# Patient Record
Sex: Female | Born: 1940 | Race: White | Hispanic: No | Smoking: Former smoker
Health system: Southern US, Community
[De-identification: ages and names within clinical notes are randomized; demographics above are authoritative.]

## PROBLEM LIST (undated history)

## (undated) DIAGNOSIS — F32A Depression, unspecified: Secondary | ICD-10-CM

## (undated) DIAGNOSIS — D649 Anemia, unspecified: Secondary | ICD-10-CM

## (undated) DIAGNOSIS — Z86010 Personal history of colonic polyps: Secondary | ICD-10-CM

## (undated) DIAGNOSIS — E785 Hyperlipidemia, unspecified: Secondary | ICD-10-CM

## (undated) DIAGNOSIS — D509 Iron deficiency anemia, unspecified: Secondary | ICD-10-CM

## (undated) DIAGNOSIS — K228 Other specified diseases of esophagus: Secondary | ICD-10-CM

## (undated) DIAGNOSIS — M1990 Unspecified osteoarthritis, unspecified site: Secondary | ICD-10-CM

## (undated) DIAGNOSIS — I10 Essential (primary) hypertension: Secondary | ICD-10-CM

## (undated) DIAGNOSIS — H9193 Unspecified hearing loss, bilateral: Secondary | ICD-10-CM

## (undated) DIAGNOSIS — J479 Bronchiectasis, uncomplicated: Secondary | ICD-10-CM

## (undated) DIAGNOSIS — E119 Type 2 diabetes mellitus without complications: Secondary | ICD-10-CM

## (undated) DIAGNOSIS — K219 Gastro-esophageal reflux disease without esophagitis: Secondary | ICD-10-CM

## (undated) DIAGNOSIS — Z972 Presence of dental prosthetic device (complete) (partial): Secondary | ICD-10-CM

## (undated) DIAGNOSIS — T7840XA Allergy, unspecified, initial encounter: Secondary | ICD-10-CM

## (undated) DIAGNOSIS — H269 Unspecified cataract: Secondary | ICD-10-CM

## (undated) DIAGNOSIS — F329 Major depressive disorder, single episode, unspecified: Secondary | ICD-10-CM

## (undated) HISTORY — PX: WISDOM TOOTH EXTRACTION: SHX21

## (undated) HISTORY — DX: Major depressive disorder, single episode, unspecified: F32.9

## (undated) HISTORY — DX: Unspecified osteoarthritis, unspecified site: M19.90

## (undated) HISTORY — DX: Unspecified cataract: H26.9

## (undated) HISTORY — DX: Iron deficiency anemia, unspecified: D50.9

## (undated) HISTORY — DX: Bronchiectasis, uncomplicated: J47.9

## (undated) HISTORY — DX: Essential (primary) hypertension: I10

## (undated) HISTORY — DX: Gastro-esophageal reflux disease without esophagitis: K21.9

## (undated) HISTORY — DX: Presence of dental prosthetic device (complete) (partial): Z97.2

## (undated) HISTORY — DX: Allergy, unspecified, initial encounter: T78.40XA

## (undated) HISTORY — DX: Anemia, unspecified: D64.9

## (undated) HISTORY — DX: Hyperlipidemia, unspecified: E78.5

## (undated) HISTORY — DX: Personal history of colonic polyps: Z86.010

## (undated) HISTORY — DX: Other specified diseases of esophagus: K22.8

## (undated) HISTORY — DX: Type 2 diabetes mellitus without complications: E11.9

## (undated) HISTORY — DX: Depression, unspecified: F32.A

## (undated) HISTORY — DX: Unspecified hearing loss, bilateral: H91.93

## (undated) HISTORY — PX: UPPER GASTROINTESTINAL ENDOSCOPY: SHX188

---

## 1975-07-12 HISTORY — PX: OTHER SURGICAL HISTORY: SHX169

## 2006-01-24 ENCOUNTER — Ambulatory Visit: Payer: Self-pay | Admitting: Cardiology

## 2006-01-27 ENCOUNTER — Ambulatory Visit: Payer: Self-pay

## 2006-01-31 ENCOUNTER — Ambulatory Visit: Payer: Self-pay | Admitting: Internal Medicine

## 2006-02-24 ENCOUNTER — Ambulatory Visit: Payer: Self-pay | Admitting: Cardiology

## 2006-03-10 ENCOUNTER — Ambulatory Visit: Payer: Self-pay | Admitting: Internal Medicine

## 2006-03-16 ENCOUNTER — Encounter (INDEPENDENT_AMBULATORY_CARE_PROVIDER_SITE_OTHER): Payer: Self-pay | Admitting: *Deleted

## 2006-03-16 ENCOUNTER — Ambulatory Visit: Payer: Self-pay | Admitting: Internal Medicine

## 2006-04-13 ENCOUNTER — Ambulatory Visit: Payer: Self-pay | Admitting: Internal Medicine

## 2006-04-20 ENCOUNTER — Ambulatory Visit: Payer: Self-pay | Admitting: Internal Medicine

## 2007-05-16 ENCOUNTER — Ambulatory Visit: Payer: Self-pay | Admitting: Internal Medicine

## 2007-08-31 DIAGNOSIS — E785 Hyperlipidemia, unspecified: Secondary | ICD-10-CM

## 2007-08-31 DIAGNOSIS — K219 Gastro-esophageal reflux disease without esophagitis: Secondary | ICD-10-CM

## 2007-08-31 DIAGNOSIS — K449 Diaphragmatic hernia without obstruction or gangrene: Secondary | ICD-10-CM | POA: Insufficient documentation

## 2007-08-31 DIAGNOSIS — I10 Essential (primary) hypertension: Secondary | ICD-10-CM | POA: Insufficient documentation

## 2007-08-31 DIAGNOSIS — K5900 Constipation, unspecified: Secondary | ICD-10-CM | POA: Insufficient documentation

## 2007-08-31 DIAGNOSIS — R002 Palpitations: Secondary | ICD-10-CM

## 2009-02-17 ENCOUNTER — Encounter (INDEPENDENT_AMBULATORY_CARE_PROVIDER_SITE_OTHER): Payer: Self-pay | Admitting: *Deleted

## 2009-03-27 ENCOUNTER — Ambulatory Visit: Payer: Self-pay | Admitting: Internal Medicine

## 2009-03-27 DIAGNOSIS — Z860100 Personal history of colon polyps, unspecified: Secondary | ICD-10-CM

## 2009-03-27 HISTORY — DX: Personal history of colonic polyps: Z86.010

## 2009-03-27 HISTORY — DX: Personal history of colon polyps, unspecified: Z86.0100

## 2009-05-01 ENCOUNTER — Ambulatory Visit: Payer: Self-pay | Admitting: Internal Medicine

## 2009-05-14 ENCOUNTER — Ambulatory Visit: Payer: Self-pay | Admitting: Internal Medicine

## 2009-05-14 HISTORY — PX: COLONOSCOPY: SHX174

## 2009-08-03 ENCOUNTER — Telehealth (INDEPENDENT_AMBULATORY_CARE_PROVIDER_SITE_OTHER): Payer: Self-pay | Admitting: *Deleted

## 2010-08-10 NOTE — Progress Notes (Signed)
   Phone Note Call from Patient   Caller: PT  Details for Reason: Records Initial call taken by: Waynetta Pean with Pt, she is asking for her Stress to be sen tover to another doctor, I have emailed her a ROI.   Appended Document:  Recieved ROI back, Faxed Records over to Dr.Steven Rohrbeck's office

## 2010-11-23 NOTE — Assessment & Plan Note (Signed)
[facility]                         GASTROENTEROLOGY OFFICE NOTE   NAME:Lepore, Juliet                          MRN:          [id number]  DATE:05/16/2007                            DOB:          [birth year]    CHIEF COMPLAINT:  Discuss stomach polyps.   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  I  brought her back in just to review things.  She also has reflux disease.  She has switched from Omeprazole to Zantac 75 mg b.i.d., but is having  to take some Pepto-Bismol later in the day, which is causing some  constipation problems.  She came off the Omeprazole because of the  concern about the fundic gland polyps and their relationship to the PPI  therapy.  She is otherwise doing well.   MEDICATIONS:  Listed and reviewed in the chart.   ALLERGIES:  Listed and reviewed, and they are updated, as well.   PAST MEDICAL HISTORY:  See January 31, 2006 and April 13, 2006 notes.   Height 5 feet, 6 inches.  Weight 168 pounds.  Pulse 63, blood pressure  120/78.   Of note, there is no dysphagia.  Her weight is stable.   We also discussed her adenomatous colon polyp.  I do not think there is  any link between that and her fundic gland polyps, though there can be  some familial polyposis type syndromes.  She does not really have  evidence for that, and I really think that the fundic gland polyps are  related to her proton pump inhibitor therapy.   ASSESSMENT:  1. Benign fundic gland polyps.  2. Gastroesophageal reflux disease, almost controlled on Zantac 75 mg      b.i.d. plus Pepto-Bismol.  3. Constipation related to Pepto-Bismol.  4. Previous history of adenomatous colon polyp.   PLAN:  1. I have prescribed ranitidine 150 mg b.i.d.  2. Stop the Pepto-Bismol.  3. If she has persistent problems, she is to call me back.  4. Plan for repeat colonoscopy in 2010.  She had a sessile 8 mg      tubular adenoma.  I chose 3-year follow up rather  than 5 years      because of the sessile nature of the lesion and these multiple      polyps does raise a small question about a polyp syndrome, but I do      think these      are really from her proton pump inhibitor therapy, and we have      agreed not to re-biopsy them today.     Iva Boop, MD,FACG  Electronically Signed    CEG/MedQ  DD: 05/16/2007  DT: 05/17/2007  Job #: [id number]   cc:   Mosetta Putt, M.D.

## 2010-11-26 NOTE — Assessment & Plan Note (Signed)
[facility]                           GASTROENTEROLOGY OFFICE NOTE   NAME:[name], [name]                          MRN:          [id number]  DATE:01/31/2006                            DOB:          07/15/1940    REASON FOR CONSULTATION:  Gastric polyps.  Never had colon cancer screening.   ASSESSMENT:  38.  71 year old white woman with a history of multiple gastric polyps that I      assume are benign fundic gland polyps.  Last endoscopy was a year ago in      [facility].  I do not have those records other than a discharge sheet      she had and it indicated that she had an inlet patch in the esophagus      and had numerous gastric polyps.  She tells me the biopsies were      negative.  She also has a small hiatal hernia.  I have reviewed records      going back to 1999 with that respect.  I suspect these are fundic gland      polyps, perhaps potentially related to chronic proton pump inhibitor use      which she takes for gastroesophageal reflux disease.  2.  She is an appropriate candidate for colon cancer screening.  She has      declined to have that in the past but is now agreeable to colonoscopy.  3.  She may be at high risk of colon polyps given these numerous gastric      polyps.  Overall I suspect the gastric polyps are benign and not a big      issue and could simply be followed if at all.   PLAN:  1.  She raised the question of getting off her proton pump inhibitors so a      Nissen fundoplication is a possibility.  I have given her some      literature about that.  2.  Because of the potential for a fundoplication a repeat endoscopy with      measurement of the Z line (in case we need to do pH probe) will be      undertaken as well as examination and sampling of her polyps again.  3.  Schedule screening colonoscopy.  4.  Risks, benefits and indication of disease have been explained.   HISTORY:  70 year old woman with problems as above.   On daily omeprazole she  tends to do pretty well with limited, if any reflux.  She takes that in the  evening typically.  Her bowel habits are okay.  She belches frequently and  has a dull ache behind the right shoulder blade when she belches.  I have  reviewed the records of her previous endoscopies and these are in my office  notes.  She has been trying to lose a little of the weight.  There is no  regurgitation, dysphagia or hematemesis or other GI bleeding.  She recently  had a stress test.  She has had some chest discomfort.   MEDICATIONS:  1.  Bisoprolol 5 mg daily.  2.  Hydrochlorothiazide 25 mg daily.  3.  Diovan 80 mg daily.  4.  Simvastatin 40 mg daily.  5.  Omeprazole 20 mg daily.  6.  Multivitamin daily.   ALLERGIES:  Drug allergies over-the-counter COLD MEDICATIONS cause  palpitations.  CODEINE causes nausea and vomiting.  ACE INHIBITORS  cause  some sort of palpitation.   PAST MEDICAL HISTORY:  1.  Stress test was clinically negative, electrically positive.  2.  Hypertension.  3.  Dyslipidemia.  4.  Palpitations, tachyarrhythmias/tachycardia.  5.  Allergies.  6.  Osteoarthritis.  7.  Hysterectomy in 1977.  8.  The patient did have cervical cancer which prompted the hysterectomy.   FAMILY HISTORY:  No colon cancer. A sister has Hepatitis C.   SOCIAL HISTORY:  She is married.  She lives in [place].  Her daughter  referred her to Dr. Duaine Dredge and physicians in [place].  She was living  in [place] at one point, that is where she was from and she moved down  here a number of years ago.  No alcohol, tobacco or drugs at this point.  Prescription plan is [facility] Medicare D.   REVIEW OF SYSTEMS:  See medical history form.   PHYSICAL EXAMINATION:  GENERAL:  No acute distress, well developed, well  nourished white woman.  VITAL SIGNS:  Height 5 foot 6 inches.  Weight 171 pounds.  Blood pressure  130/74.  Pulse 80.  HEENT:  Eyes anicteric. ENT normal mouth,  throat, pharynx.  NECK:  Supple, no masses.  CHEST:  Clear.  HEART:  S1, S2.  ABDOMEN:  Soft, non-tender without mass.  LYMPHATICS:  No neck crease.  EXTREMITIES:  No edema.  SKIN:  No rash.  NEUROLOGICAL:  Alert and oriented x3.   LABORATORY DATA:  I have reviewed CBC, CMET from Dr. Duaine Dredge on June 26 it  is normal.  TSH was normal.  Lipase normal.  Office notes reviewed as well.   Upper gastrointestinal endoscopy September 26, 1997 showed 13 small polyps.  Upper gastrointestinal endoscopy September 22, 2000 showed a 4 cm hiatal hernia  and the gastric polyps as well.   In the EGD Nov 16, 2004 the discharge instructions are reviewed.  I  appreciate the opportunity to care for this patient.                                   Iva Boop, MD, [facility]   CEG/MedQ  DD:  01/31/2006  DT:  02/01/2006  Job #:  [id number]   cc:   Mosetta Putt, MD

## 2010-11-26 NOTE — Assessment & Plan Note (Signed)
[facility]                           GASTROENTEROLOGY OFFICE NOTE   NAME:[name], [name]                          MRN:          [id number]  DATE:04/13/2006                            DOB:          04-13-1941    CHIEF COMPLAINT:  Followup after endoscopy and colonoscopy, epigastric pain  and hemorrhoids.   HISTORY OF PRESENT ILLNESS:  A 70 year old, white woman who recently had  upper endoscopy and colonoscopy to followup gastric polyps and for  screening.  She had multiple fundi gland polyps.  This is as before.  She  had a tubular adenoma (8 mm) removed.  A 3-year followup is recommended.  She has received her letter.   For the past 3-4 days, she has had epigastric pain radiating to the back  with meals.  It seems to have resolved in the last 24 hours.  She went to  soup and crackers.  The pain presented every time she ate.  She does not  recall having pain like that recently.  Years ago, she had an ultrasound  that showed gravel, she says.   Regarding her stomach polyps, she has been concerned about these.  We  discussed this today.  She tells me they first started when she was on  Zantac.  She has no reflux symptoms on omeprazole, but could not control her  symptoms on Zantac.  This endoscopy is about the third she has had over the  [place]  She has a swollen nodule at the  anus, thought to be an external hemorrhoid that has thrombosed.  Her  gynecologist told her this and she was put on betamethasone cream for this.   MEDICATIONS:  Listed and reviewed in the chart.   PAST MEDICAL HISTORY:  Reviewed in the chart from note of January 31, 2006.   PHYSICAL EXAMINATION:  GENERAL:  Well-developed, well-nourished, white woman  in no acute distress.  VITAL SIGNS:  Weight 171 pounds, pulse 60, blood pressure 126/74.  ABDOMEN:  Soft, nontender without organomegaly or mass.  RECTAL:  Inspection of the rectal area with  female nursing staff present  shows a small pea-sized nodule at approximately 8 o'clock in the left  lateral decubitus position that his mildly tender.  This is consistent with  an external thrombosed hemorrhoid.  There has been no fever, jaundice or  dark urine.   ASSESSMENT:  1. Benign fundi glad polyps.  This could be related to proton pump      inhibitor and/or H2-blocker use.  There has been no evidence of cancer      on three endoscopies.  These in general are not precancerous lesions      and this is explained to the patient.  She had inquired about the      possibility of getting off her proton pump inhibitor for her reflux to      try and to reduce these polyps.  She had wondered about a laparoscopic      fundoplication procedure.  I explained this procedure to her today and  offered an appointment to a surgeon to discuss, but she is not inclined      to do so at this point.  I think that is a sound decision based upon      the available evidence in her situation.  2. Gastroesophageal reflux disease as above.  3. Thrombosed hemorrhoid, try analpram cream over the betamethasone, Sitz      baths, should resolve within a few weeks.  Call back if not.  4. Epigastric pain.  This sounds like it could be symptomatic      cholelithiasis.  Will check LFTs, amylase, lipase and ultrasound.  If      she has gallstones, I think it would be reasonable for her to see a      surgeon about possible cholecystectomy.  I will call with the results.      Handouts on gallstones and laparoscopic cholecystectomy are provided to      the patient today.   I appreciate the opportunity to care for this patient.       Iva Boop, MD,FACG      CEG/MedQ  DD:  04/13/2006  DT:  04/14/2006  Job #:  [id number]   cc:   Mosetta Putt, M.D.

## 2010-11-26 NOTE — Assessment & Plan Note (Signed)
[facility]                              CARDIOLOGY OFFICE NOTE   NAME:[name] [name], [name]                          MRN:          [id number]  DATE:01/24/2006                            DOB:          1941/05/04    The primary is Dr. Mosetta Putt.   REASON FOR REFERRAL:  Evaluate patient with hypertension, palpitations and  an abnormal EKG.   HISTORY OF PRESENT ILLNESS:  The patient is a pleasant 70 year old white  female with no prior cardiac history.  She did have a treadmill some years  ago that apparently was normal.  She also had palpitations 10 years ago and  was started on a beta-blocker.  She thinks this improved her symptoms.  She  continues to have skipped heart beats.  She wakes with her heart rate  feeling like it is going fast, however, when she is taking it it has only  been in the 80s.  She does notice rare palpitations, particularly when she  is anxious.  She describes them as isolated skipped beats.  She has not had  any sustained tachyarrhythmias after waking during the day.  She has noticed  her heart rate being slightly irregular occasionally when taking her blood  pressure but does not always feel this.  She has worn a Holter monitor.  She  had one episode of palpitations that did not correlate with arrhythmia.  There were some premature ectopic complexes noted, but it was otherwise  sinus rhythm.   The patient was also noted to have an EKG with anterior T-wave inversions  without old EKGs for comparison.   The patient is active, climbing stairs and chasing after two grandchildren  who she takes care of quite frequently.  She says she cannot bring on any  symptoms with this.  She denies any chest pressure, neck discomfort, arm  discomfort, activity-induced nausea, vomiting or excessive diaphoresis.  She  has no shortness of breath and denies any PND or orthopnea.   PAST MEDICAL HISTORY:  Hypertension x10 years, gastroesophageal  reflux  disease, hiatal hernia, hyperlipidemia.   PAST SURGICAL HISTORY:  Cervical biopsy and hysterectomy.   ALLERGIES:  None.   MEDICATIONS:  Bisoprolol 5 mg q.d., hydrochlorothiazide 25 mg, omeprazole 20  mg q.d., Diovan 80 mg q.d., simvastatin 40 mg q.d.   SOCIAL HISTORY:  The patient is a homemaker.  She is married.  She has one  child.   FAMILY HISTORY:  Contributory for a father dying of myocardial infarction at  59 and a sister who is alive at age 68 but had peripheral arterial disease  and MI at 60.   REVIEW OF SYSTEMS:  As stated in the HPI and otherwise positive for hearing  loss, fluctuating hypertension, diarrhea, knee pain.  Negative for other  systems.   PHYSICAL EXAMINATION:  GENERAL:  The patient is slightly anxious but in no  distress.  VITAL SIGNS:  Blood pressure 178/84, heart rate 57 and regular.  Weight 171  pounds.  HEENT:  Eyes unremarkable.  Pupils are equal, round and reactive to light.  Fundi within normal limits.  Oral mucosa unremarkable.  NECK:  No jugular venous distention.  Wave form within normal limits.  Carotid upstroke brisk and symmetric.  No bruits or thyromegaly.  LYMPHATICS:  No cervical, axillary or inguinal adenopathy.  LUNGS:  Clear to auscultation bilaterally.  BACK:  No costovertebral angle tenderness.  CHEST:  Unremarkable.  HEART:  PMI not displaced or sustained.  S1 and S2 within normal limits.  No  S3.  No S4.  No murmurs.  ABDOMEN:  Flat.  Positive bowel sounds.  Normal in frequency and pitch.  No  bruits.  No rebound. No guarding.  No midline pulsatile mass.  No  hepatomegaly.  No splenomegaly.  SKIN:  No rashes.  No nodules.  EXTREMITIES:  Pulses 2+ throughout.  No edema.  No cyanosis or clubbing.  NEURO:  Oriented to person, place and time.  Cranial nerves II-XII are  grossly intact.  Motor grossly intact.   EKG:  Sinus rhythm, rate 57.  Axis within normal limits.  Intervals within  normal limits.  Anterior T-wave  inversions unchanged from the EKG done in  Dr. Geoffery Lyons office.   Palpitations.  I reviewed this with her and the nature of these on the  Holter.  I described the benign nature and suggested continued conservative  management and continuation of low-dose beta-blocker.  If she has more  problems in the future she will let me know but right now she was comforted  by the fact that these are benign lesions.  Hypertension.  I reviewed her entire recorded blood pressure diary on her  automated cuff.  She has some asymptomatic systolics in the 90s.  There was  one in the 60s which was probably an error.  She will occasionally go to a  systolic in the 150s, but this does not appear to be sustained.  At this  point I would not make a change from medical regimen.  I would have her  continue to keep the diary so we can see if there is a trend upward.  Abnormal EKG.  She has a very strong family history.  Given this, I will  screen her with a stress perfusion study.  She will continue with aggressive  primary risk reduction per Dr. Duaine Dredge.  We  discussed this at length.  Followup.  I will see her back based on the results of the stress test.                               [name] Rotunda, MD, [facility]    JH/MedQ  DD:  01/24/2006  DT:  01/25/2006  Job #:  [id number]   cc:   Mosetta Putt, MD

## 2010-11-26 NOTE — Assessment & Plan Note (Signed)
[facility]                              CARDIOLOGY OFFICE NOTE   NAME:Northup, Mitchelle                          MRN:          [id number]  DATE:02/24/2006                            DOB:          12-18-1940    PRIMARY CARE PHYSICIAN:  Mosetta Putt, M.D.   REASON FOR PRESENTATION:  Hypertension and palpitations.   HISTORY OF PRESENT ILLNESS:  The patient returns for followup.  At the last  visit discussed the perfusion study which demonstrated an ejection fraction  of greater than 70%.  No evidence of ischemia or infarction.  She is back to  review this.  She has many questions still about her PVC's and PAC's.  They  are not particularly symptomatic, particularly if she takes bisoprolol.  She  has had no chest discomfort or shortness of breath.  She has had no pre-  syncope or syncope.  She has no PND or orthopnea.  Of note, she brought her  blood pressure machine, and though she has been hypertensive in the office,  she has very well-controlled blood pressures at home with a machine that has  been correlated with office readings.   PAST MEDICAL/SURGICAL HISTORY:  1. Hypertension x10 years.  2. Gastroesophageal reflux disease.  3. Hiatal hernia.  4. Hyperlipidemia.  5. Cervical biopsy.  6. Hysterectomy.   ALLERGIES:  No known drug allergies.   MEDICATIONS:  1. Bisoprolol 5 mg daily.  2. Hydrochlorothiazide 25 mg daily.  3. Diovan 80 mg daily.  4. Simvastatin 40 mg daily.  5. Omeprazole 20 mg daily.   REVIEW OF SYSTEMS:  As stated in the HPI.  Otherwise negative for other  systems.   PHYSICAL EXAMINATION:  GENERAL:  The patient is in no distress.  VITAL SIGNS:  Blood pressure 162/82, heart rate 59 and regular, weight 173  pounds, body mass index is 28.  NECK:  No jugular venous distention.  Wave form within normal limits.  Carotid upstroke brisk and symmetrical.  No bruits, no thyromegaly.  LYMPHATICS:  No nodes.  LUNGS:  Clear to  auscultation bilaterally.  BACK:  No costovertebral angle tenderness.  CHEST:  Unremarkable.  HEART:  PMI not displaced or sustained.  S1 and S2 within normal limits.  No  S3, no S4.  No murmurs.  ABDOMEN:  Flat, positive bowel sounds.  Normal frequency and pitch.  No  bruits, no rebound, no guarding, no midline pulsatile mass, no organomegaly.  SKIN:  No rashes.  EXTREMITIES/NEUROLOGIC:  Extremities with 1+ pulses, no edema.   ELECTROCARDIOGRAM:  Sinus bradycardia, rate 59, axis within normal limits.  Intervals within normal limits.  No ST-T wave changes.   ASSESSMENT/PLAN:  1. Premature ventricular contractions/premature atrial contractions:  We      discussed this again at length.  She understands the benign nature and      feels much better about it.  She will continue with the bisoprolol, as      this does seem to suppress some of the symptoms.  2. Hypertension:  Blood pressure seems to be white coat, as  it is      controlled in the office.  She will keep a diary for Dr. Mosetta Putt      and can have her Diovan titrated as needed going forward.  3. Abnormal electrocardiogram:  There is no evidence of ischemia or      infarction.  We discussed primary risk reduction.  No further testing      is indicated.   FOLLOWUP:  She can come back to this clinic as needed.                               Rollene Rotunda, MD, [facility]    JH/MedQ  DD:  02/24/2006  DT:  02/24/2006  Job #:  [id number]   cc:   Mosetta Putt, MD

## 2014-04-03 ENCOUNTER — Other Ambulatory Visit: Payer: Self-pay | Admitting: Dermatology

## 2014-05-11 HISTORY — PX: COLONOSCOPY: SHX174

## 2014-06-23 ENCOUNTER — Encounter: Payer: Self-pay | Admitting: Internal Medicine

## 2014-10-14 ENCOUNTER — Encounter: Payer: Self-pay | Admitting: Internal Medicine

## 2015-07-12 HISTORY — PX: TOTAL HIP ARTHROPLASTY: SHX124

## 2017-10-11 ENCOUNTER — Telehealth: Payer: Self-pay | Admitting: Internal Medicine

## 2017-10-20 NOTE — Telephone Encounter (Signed)
Dr.Gessner reviewed records and accepted patient for colonoscopy due to hx of polyps. Left message on voicemail for patient to call back and schedule. Records placed in referral box for now.

## 2017-10-20 NOTE — Telephone Encounter (Signed)
Pt returned call and wants to schedule in July when her daughter is out of work for the summer.  She said she will try to call back next week and see if the July calender is available.  Records in the review folder

## 2017-10-30 ENCOUNTER — Encounter: Payer: Self-pay | Admitting: Internal Medicine

## 2017-11-06 ENCOUNTER — Telehealth: Payer: Self-pay | Admitting: Internal Medicine

## 2017-11-06 NOTE — Telephone Encounter (Signed)
Patient advised that she will need to have the labs sent to [place].  She will have her primary care fax the results

## 2017-11-06 NOTE — Telephone Encounter (Signed)
Patient states she just had a ov with her pcp in [place]. Patient states due to her lab work there, her pcp says she is anemic and has low hemoglobin. Patient states pcp wants her to do an endo with colon on 7.22.19. Pt wants to know if she can direct schedule or do ov first. Please advise.

## 2017-11-07 NOTE — Telephone Encounter (Signed)
Records in your office.  Patient would like to have an endo with her colonoscopy scheduled in June.  See labs she was told she was anemic and needed a EGD.  We have iron studies but no CBC (in your office).  No other problems or complaints from the patient.  She is scheduled for colon in June and would like to add on EGD.

## 2017-11-10 NOTE — Telephone Encounter (Signed)
Ferritin 28 10 % sat w/ NL TIBC May have some iron deficiency so OK TO ADD EGD TO COLONOSCOPY DX IRON DEFICIENCY

## 2017-11-10 NOTE — Telephone Encounter (Signed)
Endo added.  I left a detailed message for the patient to keep pre-visit appt and that Dr. Leone Payor ok'd double.

## 2017-11-23 ENCOUNTER — Telehealth: Payer: Self-pay | Admitting: Internal Medicine

## 2017-11-23 NOTE — Telephone Encounter (Signed)
Patient notified that at this time No openings, but I assured her that I would monitor for an earlier appt.

## 2018-01-16 ENCOUNTER — Ambulatory Visit (AMBULATORY_SURGERY_CENTER): Payer: Self-pay | Admitting: *Deleted

## 2018-01-16 ENCOUNTER — Other Ambulatory Visit: Payer: Self-pay

## 2018-01-16 VITALS — Ht 65.0 in | Wt 172.8 lb

## 2018-01-16 DIAGNOSIS — D500 Iron deficiency anemia secondary to blood loss (chronic): Secondary | ICD-10-CM

## 2018-01-16 DIAGNOSIS — Z86010 Personal history of colonic polyps: Secondary | ICD-10-CM

## 2018-01-16 NOTE — Progress Notes (Signed)
Patient denies any allergies to egg or soy products. Patient denies complications with anesthesia/sedation.  Patient denies oxygen use at home and denies diet medications. Pamphlet was given on colonoscopy.  Patient informed to stop her iron pills on Wednesday, July 17, five days prior to procedure.  Patient verbalized understanding.

## 2018-01-26 ENCOUNTER — Telehealth: Payer: Self-pay | Admitting: Internal Medicine

## 2018-01-26 NOTE — Telephone Encounter (Signed)
Patient has questions about what she can drink on Monday for her prep.  She is "prediabetic".  All questions answered about what she can drink on her prep day.  She will call back for any additional questions or concerns.

## 2018-01-26 NOTE — Telephone Encounter (Signed)
Pt has questions regarding instructions and blood sugar levels on her procedure scheduled on Monday at 8:30am.

## 2018-01-29 ENCOUNTER — Ambulatory Visit (AMBULATORY_SURGERY_CENTER): Payer: Medicare Other | Admitting: Internal Medicine

## 2018-01-29 ENCOUNTER — Encounter: Payer: Self-pay | Admitting: Internal Medicine

## 2018-01-29 VITALS — BP 144/57 | HR 56 | Temp 97.8°F | Resp 18 | Ht 65.0 in | Wt 172.0 lb

## 2018-01-29 DIAGNOSIS — D500 Iron deficiency anemia secondary to blood loss (chronic): Secondary | ICD-10-CM

## 2018-01-29 DIAGNOSIS — K317 Polyp of stomach and duodenum: Secondary | ICD-10-CM

## 2018-01-29 DIAGNOSIS — D508 Other iron deficiency anemias: Secondary | ICD-10-CM

## 2018-01-29 DIAGNOSIS — Z86010 Personal history of colonic polyps: Secondary | ICD-10-CM

## 2018-01-29 MED ORDER — SODIUM CHLORIDE 0.9 % IV SOLN: 500.0000 mL | Freq: Once | INTRAVENOUS | Status: DC

## 2018-01-29 NOTE — Patient Instructions (Addendum)
I did not see anything bad - no cancer!  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 Once I see biopsy results I will let you know.  I appreciate the opportunity to care for you. Iva Booparl E. Velicia Dejager, MD, [facility]   Please read handouts on polyps, hemorrhoids, and diverticulosis. Continue present medications.  YOU HAD AN ENDOSCOPIC PROCEDURE TODAY AT THE [facility]:   Refer to the procedure report that was given to you for any specific questions about what was found during the examination.  If the procedure report does not answer your questions, please call your gastroenterologist to clarify.  If you requested that your care partner not be given the details of your procedure findings, then the procedure report has been included in a sealed envelope for you to review at your convenience later.  YOU SHOULD EXPECT: Some feelings of bloating in the abdomen. Passage of more gas than usual.  Walking can help get rid of the air that was put into your GI tract during the procedure and reduce the bloating. If you had a lower endoscopy (such as a colonoscopy or flexible sigmoidoscopy) you may notice spotting of blood in your stool or on the toilet paper. If you underwent a bowel prep for your procedure, you may not have a normal bowel movement for a few days.  Please Note:  You might notice some irritation and congestion in your nose or some drainage.  This is from the oxygen used during your procedure.  There is no need for concern and it should clear up in a day or so.  SYMPTOMS TO REPORT IMMEDIATELY:   Following lower endoscopy (colonoscopy or flexible sigmoidoscopy):  Excessive amounts of blood in the stool  Significant  tenderness or worsening of abdominal pains  Swelling of the abdomen that is new, acute  Fever of 100F or higher   Following upper endoscopy (EGD)  Vomiting of blood or coffee ground material  New chest pain or pain under the shoulder blades  Painful or persistently difficult swallowing  New shortness of breath  Fever of 100F or higher  Black, tarry-looking stools  For urgent or emergent issues, a gastroenterologist can be reached at any hour by calling [phone].   DIET:  We do recommend a small meal at first, but then you may proceed to your regular diet.  Drink plenty of fluids but you should avoid alcoholic beverages for 24 hours.  ACTIVITY:  You should plan to take it easy for the rest of today and you should NOT DRIVE or use heavy machinery until tomorrow (because of the sedation medicines used during the test).    FOLLOW UP: Our staff will call the number listed on your records the next business day following your procedure to check on you and address any questions or concerns that you may have regarding the information given to you following your procedure. If we do not reach you, we will leave a message.  However, if you are feeling well and you are not experiencing any problems, there is no need to return our call.  We will assume that you have returned to your regular daily activities without incident.  If any biopsies were taken you will be contacted by phone or by letter within the next 1-3 weeks.  Please call us at [phone] if you have not heard about the biopsies in 3 weeks.    SIGNATURES/CONFIDENTIALITY: You and/or your care partner have signed paperwork which will be entered into your electronic medical record.  These signatures attest to the fact that that the information above on your After Visit Summary has been reviewed and is understood.  Full responsibility of the confidentiality of this discharge information lies with you and/or your care-partner.

## 2018-01-29 NOTE — Op Note (Addendum)
[facility] Patient Name: [name] [name] Procedure Date: 01/29/2018 9:17 AM MRN: [id number] Endoscopist: Iva Boop , MD Age: 77 Referring MD:  Date of Birth: Mar 14, 1941 Gender: Female Account #: [id number] Procedure:                Colonoscopy Indications:              Iron deficiency anemia, Personal history of colonic                            polyps Medicines:                Propofol per Anesthesia, Monitored Anesthesia Care Procedure:                Pre-Anesthesia Assessment:                           - Prior to the procedure, a History and Physical                            was performed, and patient medications and                            allergies were reviewed. The patient's tolerance of                            previous anesthesia was also reviewed. The risks                            and benefits of the procedure and the sedation                            options and risks were discussed with the patient.                            All questions were answered, and informed consent                            was obtained. Prior Anticoagulants: The patient has                            taken no previous anticoagulant or antiplatelet                            agents. ASA Grade Assessment: II - A patient with                            mild systemic disease. After reviewing the risks                            and benefits, the patient was deemed in                            satisfactory condition to undergo the procedure.  After obtaining informed consent, the colonoscope                            was passed under direct vision. Throughout the                            procedure, the patient's blood pressure, pulse, and                            oxygen saturations were monitored continuously. The                            Colonoscope was introduced through the anus and                            advanced to the the cecum,  identified by                            appendiceal orifice and ileocecal valve. The                            colonoscopy was performed without difficulty. The                            patient tolerated the procedure well. The quality                            of the bowel preparation was good. The bowel                            preparation used was Miralax. The ileocecal valve,                            appendiceal orifice, and rectum were photographed. Scope In: 9:29:45 AM Scope Out: 9:41:15 AM Scope Withdrawal Time: 0 hours 8 minutes 39 seconds  Total Procedure Duration: 0 hours 11 minutes 30 seconds  Findings:                 Multiple small and large-mouthed diverticula were                            found in the left colon.                           External hemorrhoids were found during                            retroflexion. The hemorrhoids were medium-sized.                           The exam was otherwise without abnormality on                            direct and retroflexion views. Complications:  No immediate complications. Estimated Blood Loss:     Estimated blood loss: none. Impression:               - Diverticulosis in the left colon.                           - External hemorrhoids. Swollen RA external                            hemorrhoid - she said she noticed after the colon                            prep.                           - The examination was otherwise normal on direct                            and retroflexion views.                           - No specimens collected. Recommendation:           - Patient has a contact number available for                            emergencies. The signs and symptoms of potential                            delayed complications were discussed with the                            patient. Return to normal activities tomorrow.                            Written discharge instructions were provided to  the                            patient.                           - Resume previous diet.                           - Continue present medications.                           - No repeat colonoscopy due to age and the absence                            of colonic polyps.                           - Await duodenal bxs                           has an esophageal AVM  continue iron                           If not celiac can consider small bowel capsule                            endoscopy vs iron Tx and save capsule for failure                            to respond, could treat esophageal AVM w/ APC vs                            RFA but with only one AVM would hold off. Iva Boop, MD 01/29/2018 9:56:23 AM This report has been signed electronically.

## 2018-01-29 NOTE — Progress Notes (Signed)
Alert and oriented x 3, pleased with MAC, report to RN. Patient taken to recovery at [phone]

## 2018-01-29 NOTE — Progress Notes (Signed)
Pt's states no medical or surgical changes since previsit or office visit. 

## 2018-01-29 NOTE — Op Note (Signed)
[facility] Patient Name: [name] KluverBetty [name] Procedure Date: 01/29/2018 9:18 AM MRN: [id number] Endoscopist: Iva Booparl E Gessner , MD Age: 77 Referring MD:  Date of Birth: May 03, 1941 Gender: Female Account #: [id number] Procedure:                Upper GI endoscopy Indications:              Iron deficiency anemia Medicines:                Propofol per Anesthesia, Monitored Anesthesia Care Procedure:                Pre-Anesthesia Assessment:                           - Prior to the procedure, a History and Physical                            was performed, and patient medications and                            allergies were reviewed. The patient's tolerance of                            previous anesthesia was also reviewed. The risks                            and benefits of the procedure and the sedation                            options and risks were discussed with the patient.                            All questions were answered, and informed consent                            was obtained. Prior Anticoagulants: The patient has                            taken no previous anticoagulant or antiplatelet                            agents. ASA Grade Assessment: II - A patient with                            mild systemic disease. After reviewing the risks                            and benefits, the patient was deemed in                            satisfactory condition to undergo the procedure.                           After obtaining informed consent, the endoscope was  passed under direct vision. Throughout the                            procedure, the patient's blood pressure, pulse, and                            oxygen saturations were monitored continuously. The                            Endoscope was introduced through the mouth, and                            advanced to the second part of duodenum. The upper                            GI  endoscopy was accomplished without difficulty.                            The patient tolerated the procedure well. 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                           Multiple 3 to 8 mm semi-sessile polyps with no                            stigmata of recent bleeding were found in the                            gastric fundus and in the gastric body.                           Diffuse atrophic mucosa was found in the second                            portion of the duodenum. Biopsies for histology                            were taken with a cold forceps for evaluation of                            celiac disease. Verification of patient                            identification for the specimen was done. Estimated                            blood  loss was minimal.                           The cardia and gastric fundus were normal on                            retroflexion. Complications:            No immediate complications. Estimated Blood Loss:     Estimated blood loss was minimal. 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 Tiny AVM, less likely traumatic red                            spot                           - Multiple gastric polyps. Has known fundic gland                            polyps and these are consistent                           - Duodenal mucosal atrophy, mild. Biopsied. ? Celiac Recommendation:           - Patient has a contact number available for                            emergencies. The signs and symptoms of potential                            delayed complications were discussed with the                            patient. Return to normal activities tomorrow.                            Written  discharge instructions were provided to the                            patient.                           - Resume previous diet.                           - Continue present medications.                           - Await pathology results.                           - See the other procedure note for documentation of  additional recommendations. Colonoscopy was negative Iva Boop, MD 01/29/2018 9:52:00 AM This report has been signed electronically.

## 2018-01-30 ENCOUNTER — Telehealth: Payer: Self-pay

## 2018-01-30 ENCOUNTER — Telehealth: Payer: Self-pay | Admitting: *Deleted

## 2018-01-30 NOTE — Telephone Encounter (Signed)
  Follow up Call-  Call back number 01/29/2018  Post procedure Call Back phone  # [phone]  Permission to leave phone message Yes  Some recent data might be hidden     Patient questions:  Do you have a fever, pain , or abdominal swelling? No. Pain Score  0 *  Have you tolerated food without any problems? Yes.    Have you been able to return to your normal activities? Yes.    Do you have any questions about your discharge instructions: Diet   No. Medications  No. Follow up visit  No.  Do you have questions or concerns about your Care? No.  Actions: * If pain score is 4 or above: No action needed, pain <4.

## 2018-01-30 NOTE — Telephone Encounter (Signed)
Left message for follow-up. 

## 2018-02-05 ENCOUNTER — Encounter: Payer: Self-pay | Admitting: Internal Medicine

## 2018-02-05 DIAGNOSIS — D509 Iron deficiency anemia, unspecified: Secondary | ICD-10-CM

## 2018-02-05 DIAGNOSIS — K228 Other specified diseases of esophagus: Secondary | ICD-10-CM | POA: Insufficient documentation

## 2018-02-05 DIAGNOSIS — K2289 Other specified disease of esophagus: Secondary | ICD-10-CM | POA: Insufficient documentation

## 2018-02-05 HISTORY — DX: Iron deficiency anemia, unspecified: D50.9

## 2018-02-05 HISTORY — DX: Other specified disease of esophagus: K22.89

## 2018-02-05 NOTE — Progress Notes (Signed)
[facility] no recall - please be sure to cc to PCP all info  Office - call patient and let her know that the biopsies I took were ok - she is not malabsorbing iron  She needs to:  1) Stay on oral iron 2) Go back to PCP soon to get CBC and iron checked 3) No more GI studies at this time - if she does not rebuild iron and blood count will consider a capsule endoscopy

## 2018-06-21 ENCOUNTER — Ambulatory Visit (INDEPENDENT_AMBULATORY_CARE_PROVIDER_SITE_OTHER): Payer: Medicare Other | Admitting: Internal Medicine

## 2018-06-21 ENCOUNTER — Encounter: Payer: Self-pay | Admitting: Internal Medicine

## 2018-06-21 VITALS — BP 122/60 | HR 64 | Ht 65.0 in | Wt 173.0 lb

## 2018-06-21 DIAGNOSIS — D500 Iron deficiency anemia secondary to blood loss (chronic): Secondary | ICD-10-CM

## 2018-06-21 DIAGNOSIS — K2289 Other specified disease of esophagus: Secondary | ICD-10-CM

## 2018-06-21 DIAGNOSIS — K228 Other specified diseases of esophagus: Secondary | ICD-10-CM | POA: Diagnosis not present

## 2018-06-21 NOTE — Patient Instructions (Signed)
If you are age 78 or older, your body mass index should be between 23-30. Your Body mass index is 28.79 kg/m. If this is out of the aforementioned range listed, please consider follow up with your Primary Care Provider.  If you are age 78 or younger, your body mass index should be between 19-25. Your Body mass index is 28.79 kg/m. If this is out of the aformentioned range listed, please consider follow up with your Primary Care Provider.   Per Dr Leone PayorGessner. Discuss IV iron fusion with your primary care doctor.   It was a pleasure to see you today!

## 2018-06-21 NOTE — Progress Notes (Signed)
[name] [name] 77 y.o. 1940/12/13 [id number]  Assessment & Plan:   Encounter Diagnoses  Name Primary?  . Iron deficiency anemia due to chronic blood loss Yes  . 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  She seems to be maintaining though is not completely repleted.  We ruled out bad problems as best we can, she understands that so she will go back to primary care and have parenteral iron administration undertaken.  If she fails to hold her hemoglobin and iron levels despite iron supplementation then I think we should consider additional work-up with capsule endoscopy and consider reassessment of this possible AVM in the esophagus and treated.  Might consider having our advanced therapeutic endoscopist, Dr. Meridee Score perform that procedure should we need to ablate.  I appreciate the opportunity to care for this patient. CC: Brooke Bonito, MD  Subjective:   Chief Complaint: Follow-up of anemia question capsule endoscopy  HPI The patient is a 77 year old white woman here with her husband to follow-up iron deficiency anemia.  She was seen in the summer and an EGD showed a possible esophageal AVM, I think it was 1, and she had fundic gland polyps and negative duodenal biopsies i.e. no celiac disease.  Hemorrhoids on colonoscopy nothing else.  No overt bleeding though iron deficiency is attributed to chronic blood loss.  Ferritin level in October was 27 which is low normal her iron saturation was 10% and TIBC 322.  She is had some constipation with her iron so she is taking it several times a week and not daily.  Her hemoglobin is 11.3 which is relatively stable her MCV is 80.9 and RDW of 16.2%.  Her primary care provider has talked about the possibility of parenteral iron.  The patient is open to that.  I had mentioned the possibility of doing a capsule endoscopy to see if there are other AVMs in the gut not seen.  I did explain I am not  100% confident that she has an AVM of the esophagus as that is rare. Allergies  Allergen Reactions  . Cold Relief Plus  [Chlorphen-Phenyleph-Asa] Palpitations  . Ace Inhibitors     tachycardia  . Codeine     Causes low BP and vomiting  . Reglan [Metoclopramide]     Causes jitters/anxiety  . Epinephrine Palpitations   Current Meds  Medication Sig  . acetaminophen (TYLENOL) 500 MG tablet Take 1,000 mg by mouth 2 (two) times daily.  . bisoprolol (ZEBETA) 5 MG tablet TAKE ONE (1) TABLET BY MOUTH EVERY DAY  . Calcium Carb-Cholecalciferol (CALCIUM 1000 + D) 1000-800 MG-UNIT TABS Take by mouth.  . calcium carbonate (TUMS - DOSED IN MG ELEMENTAL CALCIUM) 500 MG chewable tablet Chew 2 tablets by mouth as needed for indigestion or heartburn.  . ferrous sulfate 325 (65 FE) MG tablet   . fluticasone (FLONASE) 50 MCG/ACT nasal spray   . furosemide (LASIX) 20 MG tablet Take 1/2-1 tablet by mouth once daily if needed for swelling  . hydrochlorothiazide (HYDRODIURIL) 25 MG tablet TAKE ONE (1) TABLET BY MOUTH EACH DAY  . loratadine (CLARITIN) 10 MG tablet Take by mouth.  Marland Kitchen omeprazole (PRILOSEC) 20 MG capsule TAKE ONE (1) CAPSULE EACH DAY  . simvastatin (ZOCOR) 40 MG tablet TAKE ONE (1) TABLET EACH DAY AT NIGHT  . valsartan (DIOVAN) 80 MG tablet Take by mouth.  . vitamin B-12 (CYANOCOBALAMIN) 500 MCG tablet Take 500 mcg by mouth  daily.   Past Medical History:  Diagnosis Date  . Allergy   . Anemia   . 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 Arthritis    shoulders, knees  . Cataract    just watching right eye  . 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 Depression   . Diabetes mellitus without complication (HCC)    prediabetic - no meds currently  . GERD (gastroesophageal reflux disease)   . 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 Hyperlipidemia   . Hypertension   . Iron  deficiency anemia 02/05/2018  . Wears dentures    full upper dentures and partial lower    Past Surgical History:  Procedure Laterality Date  . COLONOSCOPY  05/14/2009   Gessner/ adenoma 2007  . COLONOSCOPY  05/2014   Dr Marcelene ButteHurrelbrink -[facility] - polyps  . TOTAL HIP ARTHROPLASTY Right 2017  . UPPER GASTROINTESTINAL ENDOSCOPY    . vagiinal hysterectomy  1977  . WISDOM TOOTH EXTRACTION     Social History   Social History Narrative   Married   Former smoker, no alcohol no drug use   family history includes Colon cancer in her sister; Colon polyps in her sister.   Review of Systems As above  Objective:   Physical Exam BP 122/60 (BP Location: Left Arm, Patient Position: Sitting, Cuff Size: Normal)   Pulse 64   Ht 5\' 5"  (1.651 m) Comment: height measured without shoes  Wt 173 lb (78.5 kg)   BMI 28.79 kg/m   15 minutes time spent with patient > half in counseling coordination of care

## 2020-12-14 ENCOUNTER — Ambulatory Visit: Payer: Medicare Other | Attending: Internal Medicine

## 2020-12-14 DIAGNOSIS — Z23 Encounter for immunization: Secondary | ICD-10-CM

## 2020-12-14 NOTE — Progress Notes (Signed)
   Covid-19 Vaccination Clinic  Name:  [name] [name]    MRN: [id number] DOB: 03-07-1941  12/14/2020  Ms. [name] [name] was observed post Covid-19 immunization for 15 minutes without incident. She was provided with Vaccine Information Sheet and instruction to access the V-Safe system.   Ms. [name] [name] was instructed to call 911 with any severe reactions post vaccine: Marland Kitchen Difficulty breathing  . Swelling of face and throat  . A fast heartbeat  . A bad rash all over body  . Dizziness and weakness   Immunizations Administered    Name Date Dose VIS Date Route   PFIZER Comrnaty(Gray TOP) Covid-19 Vaccine 12/14/2020 11:20 AM 0.3 mL 06/18/2020 Intramuscular   Manufacturer: [employer], [employer]   Lot: [id number]   NDC: [id number]

## 2020-12-15 ENCOUNTER — Other Ambulatory Visit (HOSPITAL_BASED_OUTPATIENT_CLINIC_OR_DEPARTMENT_OTHER): Payer: Self-pay

## 2020-12-15 MED ORDER — PFIZER-BIONT COVID-19 VAC-TRIS 30 MCG/0.3ML IM SUSP
INTRAMUSCULAR | 0 refills | Status: DC
  Filled 2020-12-15: qty 0.3, 1d supply, fill #0

## 2021-03-22 ENCOUNTER — Other Ambulatory Visit (HOSPITAL_BASED_OUTPATIENT_CLINIC_OR_DEPARTMENT_OTHER): Payer: Self-pay

## 2021-09-10 NOTE — Progress Notes (Unsigned)
Referring-[name] Brynda Rim, MD Reason for referral-atrial fibrillation  HPI: 81 year old female for evaluation of atrial fibrillation at request of Brooke Bonito, MD.  Patient previously followed at [facility].  She had an episode of atrial fibrillation and underwent cardioversion October 01, 2020 and has been treated with amiodarone.  Amiodarone was discontinued by pulmonary ultimately due to history of bronchiectasis.  Echocardiogram January 2022 showed normal LV function.  Current Outpatient Medications  Medication Sig Dispense Refill   acetaminophen (TYLENOL) 500 MG tablet Take 1,000 mg by mouth 2 (two) times daily.     bisoprolol (ZEBETA) 5 MG tablet TAKE ONE (1) TABLET BY MOUTH EVERY DAY     Calcium Carb-Cholecalciferol (CALCIUM 1000 + D) 1000-800 MG-UNIT TABS Take by mouth.     calcium carbonate (TUMS - DOSED IN MG ELEMENTAL CALCIUM) 500 MG chewable tablet Chew 2 tablets by mouth as needed for indigestion or heartburn.     COVID-19 mRNA Vac-TriS, Pfizer, (PFIZER-BIONT COVID-19 VAC-TRIS) SUSP injection Inject into the muscle. 0.3 mL 0   ferrous sulfate 325 (65 FE) MG tablet      fluticasone (FLONASE) 50 MCG/ACT nasal spray      furosemide (LASIX) 20 MG tablet Take 1/2-1 tablet by mouth once daily if needed for swelling     hydrochlorothiazide (HYDRODIURIL) 25 MG tablet TAKE ONE (1) TABLET BY MOUTH EACH DAY     loratadine (CLARITIN) 10 MG tablet Take by mouth.     omeprazole (PRILOSEC) 20 MG capsule TAKE ONE (1) CAPSULE EACH DAY     simvastatin (ZOCOR) 40 MG tablet TAKE ONE (1) TABLET EACH DAY AT NIGHT     valsartan (DIOVAN) 80 MG tablet Take by mouth.     vitamin B-12 (CYANOCOBALAMIN) 500 MCG tablet Take 500 mcg by mouth daily.     No current facility-administered medications for this visit.    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   # 1 seen 01/2018   Arthritis    shoulders, knees   Cataract    just watching right eye   COLONIC POLYPS, ADENOMATOUS, HX OF 03/27/2009   Annotation: 8 mm adenoma Qualifier: Diagnosis of  By: Leone Payor MD, Alfonse Ras E    Depression    Diabetes mellitus without complication (HCC)    prediabetic - no meds currently   GERD (gastroesophageal reflux disease)    Hearing loss, 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   Hyperlipidemia    Hypertension    Iron deficiency anemia 02/05/2018   Wears dentures    full upper dentures and partial lower     Past Surgical History:  Procedure Laterality Date   COLONOSCOPY  05/14/2009   Gessner/ adenoma 2007   COLONOSCOPY  05/2014   Dr Marcelene Butte -[facility] - polyps   TOTAL HIP ARTHROPLASTY Right 2017   UPPER GASTROINTESTINAL ENDOSCOPY     vagiinal hysterectomy  1977   WISDOM TOOTH EXTRACTION      Social History   Socioeconomic History   Marital status: Married    Spouse name: Not on file   Number of children: Not on file  Years of education: Not on file   Highest education level: Not on file  Occupational History   Not on file  Tobacco Use   Smoking status: Former    Years: 10.00    Types: Cigarettes    Quit date: 40    Years since quitting: 55.2   Smokeless tobacco: Never  Vaping Use   Vaping Use: Never used  Substance and Sexual Activity   Alcohol use: Never   Drug use: Never   Sexual activity: Not on file  Other Topics Concern   Not on file  Social History Narrative   Married   Former smoker, no alcohol no drug use   Social Determinants of Corporate investment banker Strain: Not on file  Food Insecurity: Not on file  Transportation Needs: Not on file  Physical Activity: Not on file  Stress: Not on file  Social Connections: Not on file   Intimate Partner Violence: Not on file    Family History  Problem Relation Age of Onset   Colon cancer Sister    Colon polyps Sister    Rectal cancer Neg Hx    Stomach cancer Neg Hx     ROS: no fevers or chills, productive cough, hemoptysis, dysphasia, odynophagia, melena, hematochezia, dysuria, hematuria, rash, seizure activity, orthopnea, PND, pedal edema, claudication. Remaining systems are negative.  Physical Exam:   There were no vitals taken for this visit.  General:  Well developed/well nourished in NAD Skin warm/dry Patient not depressed No peripheral clubbing Back-normal HEENT-normal/normal eyelids Neck supple/normal carotid upstroke bilaterally; no bruits; no JVD; no thyromegaly chest - CTA/ normal expansion CV - RRR/normal S1 and S2; no murmurs, rubs or gallops;  PMI nondisplaced Abdomen -NT/ND, no HSM, no mass, + bowel sounds, no bruit 2+ femoral pulses, no bruits Ext-no edema, chords, 2+ DP Neuro-grossly nonfocal  ECG - personally reviewed  A/P  1 paroxysmal atrial fibrillation-patient remains in sinus rhythm.  CHA2DS2-VASc is 7.  Continue apixaban.  Continue bisoprolol.  2 hypertension-  3 hyperlipidemia-continue statin.  Olga Millers, MD

## 2021-09-18 ENCOUNTER — Encounter: Payer: Self-pay | Admitting: Cardiology

## 2021-09-23 ENCOUNTER — Other Ambulatory Visit: Payer: Self-pay

## 2021-09-23 ENCOUNTER — Encounter: Payer: Self-pay | Admitting: Cardiology

## 2021-09-23 ENCOUNTER — Ambulatory Visit (INDEPENDENT_AMBULATORY_CARE_PROVIDER_SITE_OTHER): Payer: Medicare Other | Admitting: Cardiology

## 2021-09-23 VITALS — BP 184/86 | HR 57 | Ht 65.0 in | Wt 191.0 lb

## 2021-09-23 DIAGNOSIS — E7800 Pure hypercholesterolemia, unspecified: Secondary | ICD-10-CM

## 2021-09-23 DIAGNOSIS — I10 Essential (primary) hypertension: Secondary | ICD-10-CM

## 2021-09-23 DIAGNOSIS — I480 Paroxysmal atrial fibrillation: Secondary | ICD-10-CM

## 2021-09-23 MED ORDER — AMLODIPINE BESYLATE 5 MG PO TABS: 5.0000 mg | ORAL_TABLET | Freq: Two times a day (BID) | ORAL | 3 refills | Status: DC

## 2021-09-23 NOTE — Patient Instructions (Signed)
INCREASE AMLODIPINE TO 5 MG TWICE DAILY ? ? ? ?Follow-Up: ?At [facility], you and your health needs are our priority.  As part of our continuing mission to provide you with exceptional heart care, we have created designated Provider Care Teams.  These Care Teams include your primary Cardiologist (physician) and Advanced Practice Providers (APPs -  Physician Assistants and Nurse Practitioners) who all work together to provide you with the care you need, when you need it. ? ?We recommend signing up for the patient portal called "MyChart".  Sign up information is provided on this After Visit Summary.  MyChart is used to connect with patients for Virtual Visits (Telemedicine).  Patients are able to view lab/test results, encounter notes, upcoming appointments, etc.  Non-urgent messages can be sent to your provider as well.   ?To learn more about what you can do with MyChart, go to [email or web address].   ? ?Your next appointment:   ?6 month(s) ? ?The format for your next appointment:   ?In Person ? ?Provider:   ?Phill Mutter MD  ? ? ? ?

## 2021-10-01 ENCOUNTER — Encounter: Payer: Self-pay | Admitting: Cardiology

## 2021-10-02 ENCOUNTER — Encounter: Payer: Self-pay | Admitting: Cardiology

## 2021-10-04 ENCOUNTER — Other Ambulatory Visit: Payer: Self-pay

## 2021-10-04 MED ORDER — ELIQUIS 5 MG PO TABS: 5.0000 mg | ORAL_TABLET | Freq: Two times a day (BID) | ORAL | 5 refills | Status: DC

## 2021-10-04 NOTE — Telephone Encounter (Signed)
Prescription refill request for Eliquis received. ?Indication:Afib ?Last office visit:3/23 ?Scr:0.7 ?Age: 81 ?Weight:86.6 kg ? ?Prescription refilled ? ?

## 2021-10-26 ENCOUNTER — Encounter: Payer: Self-pay | Admitting: Cardiology

## 2021-10-27 ENCOUNTER — Emergency Department (INDEPENDENT_AMBULATORY_CARE_PROVIDER_SITE_OTHER)
Admission: EM | Admit: 2021-10-27 | Discharge: 2021-10-27 | Disposition: A | Discharge status: Home / Self Care | Payer: Medicare Other | Source: Home / Self Care | Attending: Family Medicine | Admitting: Family Medicine

## 2021-10-27 ENCOUNTER — Encounter: Payer: Self-pay | Admitting: Emergency Medicine

## 2021-10-27 DIAGNOSIS — H9202 Otalgia, left ear: Secondary | ICD-10-CM | POA: Diagnosis not present

## 2021-10-27 NOTE — ED Provider Notes (Signed)
?[facility] ? ? ? ?CSN: [id number] ?Arrival date & time: 10/27/21  1438 ? ? ?  ? ?History   ?Chief Complaint ?Chief Complaint  ?Patient presents with  ? Otalgia  ?  left  ? ? ?HPI ?[name] [name] is a 81 y.o. female.  ? ?HPI ?Patient wears hearing aids.  She states that when she removed her left hearing aid a couple of days ago there was blood on the hearing aid.  She is on Eliquis.  She is here to have her ear canal checked.  She states it is periodically uncomfortable for her.  No pain.  No runny nose or stuffy nose or sore throat. ? ?Past Medical History:  ?Diagnosis Date  ? Allergy   ? Anemia   ? 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 # 1 seen 01/2018  ? Arthritis   ? shoulders, knees  ? Bronchiectasis (HCC)   ? Cataract   ? just watching right eye  ? COLONIC POLYPS, ADENOMATOUS, HX OF 03/27/2009  ? Annotation: 8 mm adenoma Qualifier: Diagnosis of  By: Leone Payor MD, Charlyne Quale   ? Depression   ? Diabetes mellitus without complication (HCC)   ? prediabetic - no meds currently  ? GERD (gastroesophageal reflux disease)   ? Hearing loss, bilateral   ? has hearing aids  ? Hyperlipidemia   ? Hypertension   ? Iron deficiency anemia 02/05/2018  ? Wears dentures   ? full upper dentures and partial lower   ? ? ?Patient Active Problem List  ? Diagnosis Date Noted  ? Iron deficiency anemia 02/05/2018  ? 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 HYPERLIPIDEMIA 08/31/2007  ? HYPERTENSION 08/31/2007  ? GERD 08/31/2007  ? 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 CONSTIPATION 08/31/2007  ? PALPITATIONS 08/31/2007  ? ? ?Past Surgical History:  ?Procedure Laterality Date  ? COLONOSCOPY  05/14/2009  ? Gessner/ adenoma 2007  ? COLONOSCOPY  05/2014  ? Dr Marcelene Butte -[facility] - polyps  ? TOTAL HIP ARTHROPLASTY Right 2017  ? UPPER GASTROINTESTINAL ENDOSCOPY    ? vagiinal hysterectomy  1977  ? WISDOM TOOTH EXTRACTION    ? ? ?OB History   ?No obstetric history on file. ?  ? ? ? ?Home Medications   ? ?Prior to Admission medications    ?Medication Sig Start Date End Date Taking? Authorizing Provider  ?acetaminophen (TYLENOL) 500 MG tablet Take 1,000 mg by mouth 2 (two) times daily.    [provider]  ?amLODipine (NORVASC) 5 MG tablet Take 1 tablet (5 mg total) by mouth 2 (two) times daily. 09/23/21   Lewayne Bunting, MD  ?atorvastatin (LIPITOR) 20 MG tablet Take 20 mg by mouth daily. 08/06/21   [provider]  ?bisoprolol (ZEBETA) 5 MG tablet TAKE ONE (1) TABLET BY MOUTH EVERY DAY 08/14/14   [provider]  ?calcium carbonate (TUMS - DOSED IN MG ELEMENTAL CALCIUM) 500 MG chewable tablet Chew 2 tablets by mouth as needed for indigestion or heartburn.    [provider]  ?COVID-19 mRNA Vac-TriS, Pfizer, (PFIZER-BIONT COVID-19 VAC-TRIS) SUSP injection Inject into the muscle. 12/14/20   Judyann Munson, MD  ?Everlene Balls 5 MG TABS tablet Take 1 tablet (5 mg total) by mouth 2 (two) times daily. 10/04/21   Lewayne Bunting, MD  ?fluticasone Aleda Grana) 50 MCG/ACT nasal spray  01/12/18   [provider]  ?Lactobacillus Rhamnosus, GG, (RA PROBIOTIC DIGESTIVE CARE) CAPS Take by mouth.    [provider]  ?loratadine (CLARITIN) 10  MG tablet Take by mouth. 10/30/17   [provider]  ?montelukast (SINGULAIR) 10 MG tablet Take by mouth.    [provider]  ?Multiple Vitamin (MULTIVITAMIN PO) Take by mouth daily.    [provider]  ?olmesartan (BENICAR) 40 MG tablet Take 1 tablet by mouth daily. 08/16/21   [provider]  ?omeprazole (PRILOSEC) 20 MG capsule TAKE ONE (1) CAPSULE EACH DAY 08/14/14   [provider]  ?triamcinolone cream (KENALOG) 0.1 % Apply topically. 06/15/20   [provider]  ? ? ?Family History ?Family History  ?Problem Relation Age of Onset  ? Heart attack Father   ? Heart disease Sister   ? Heart disease Sister   ? Rectal cancer Neg Hx   ? Stomach cancer Neg Hx   ? ? ?Social History ?Social History  ? ?Tobacco Use  ? Smoking status: Former  ?   Years: 10.00  ?  Types: Cigarettes  ?  Quit date: 1968  ?  Years since quitting: 55.3  ? Smokeless tobacco: Never  ?Vaping Use  ? Vaping Use: Never used  ?Substance Use Topics  ? Alcohol use: Never  ? Drug use: Never  ? ? ? ?Allergies   ?Cold relief plus  [chlorphen-phenyleph-asa], Ace inhibitors, Codeine, Reglan [metoclopramide], and Epinephrine ? ? ?Review of Systems ?Review of Systems ?See HPI ? ?Physical Exam ?Triage Vital Signs ?ED Triage Vitals  ?Enc Vitals Group  ?   BP 10/27/21 1448 129/76  ?   Pulse Rate 10/27/21 1448 64  ?   Resp 10/27/21 1448 16  ?   Temp 10/27/21 1448 98.4 ?F (36.9 ?C)  ?   Temp Source 10/27/21 1448 Oral  ?   SpO2 10/27/21 1448 97 %  ?   Weight 10/27/21 1449 190 lb 14.7 oz (86.6 kg)  ?   Height 10/27/21 1449 5\' 5"  (1.651 m)  ?   Head Circumference --   ?   Peak Flow --   ?   Pain Score 10/27/21 1449 2  ?   Pain Loc --   ?   Pain Edu? --   ?   Excl. in GC? --   ? ?No data found. ? ?Updated Vital Signs ?BP 129/76 (BP Location: Right Arm)   Pulse 64   Temp 98.4 ?F (36.9 ?C) (Oral)   Resp 16   Ht 5\' 5"  (1.651 m)   Wt 86.6 kg   SpO2 97%   BMI 31.77 kg/m?  ?   ? ?Physical Exam ?Constitutional:   ?   General: She is not in acute distress. ?   Appearance: She is well-developed.  ?HENT:  ?   Head: Normocephalic and atraumatic.  ?   Right Ear: Tympanic membrane, ear canal and external ear normal.  ?   Left Ear: Tympanic membrane, ear canal and external ear normal.  ?   Nose: Nose normal. No congestion.  ?   Mouth/Throat:  ?   Pharynx: No posterior oropharyngeal erythema.  ?Eyes:  ?   Conjunctiva/sclera: Conjunctivae normal.  ?   Pupils: Pupils are equal, round, and reactive to light.  ?Cardiovascular:  ?   Rate and Rhythm: Normal rate.  ?Pulmonary:  ?   Effort: Pulmonary effort is normal. No respiratory distress.  ?Abdominal:  ?   General: There is no distension.  ?   Palpations: Abdomen is soft.  ?Musculoskeletal:     ?   General: Normal range of motion.  ?   Cervical back:  Normal range of  motion.  ?Skin: ?   General: Skin is warm and dry.  ?Neurological:  ?   Mental Status: She is alert.  ?Psychiatric:     ?   Mood and Affect: Mood normal.     ?   Behavior: Behavior normal.  ? ? ? ?UC Treatments / Results  ?Labs ?(all labs ordered are listed, but only abnormal results are displayed) ?Labs Reviewed - No data to display ? ?EKG ? ? ?Radiology ?No results found. ? ?Procedures ?Procedures (including critical care time) ? ?Medications Ordered in UC ?Medications - No data to display ? ?Initial Impression / Assessment and Plan / UC Course  ?I have reviewed the triage vital signs and the nursing notes. ? ?Pertinent labs & imaging results that were available during my care of the patient were reviewed by me and considered in my medical decision making (see chart for details). ? ?  ? ?Reassured that ear canal is not damaged ?Final Clinical Impressions(s) / UC Diagnoses  ? ?Final diagnoses:  ?Otalgia of left ear  ? ?Discharge Instructions   ?None ?  ? ?ED Prescriptions   ?None ?  ? ?PDMP not reviewed this encounter. ?  ?Eustace MooreNelson, Larell Baney Sue, MD ?10/27/21 1514 ? ?

## 2021-10-27 NOTE — ED Triage Notes (Signed)
Left ear pain after removing  hearing aid on left approx 2 days ago  ?Pt noted blood on the hearing aid  ?Pt is on a blood thinner ?Pt's hearing doctor can not see her until June ?Pt sleeps with the hearing aid in  ?

## 2021-11-05 NOTE — Progress Notes (Signed)
? ? ? ? ?HPI: FU atrial fibrillation.  Patient previously followed at [facility].  She had an episode of atrial fibrillation and underwent cardioversion October 01, 2020 and has been treated with amiodarone.  Amiodarone was discontinued by pulmonary ultimately due to history of bronchiectasis.  Echocardiogram January 2022 showed normal LV function.  Monitor January 2023 showed sinus rhythm with brief runs of PAT, PACs and PVCs and 1 triplet.  Patient has had no recurrent atrial fibrillation since her cardioversion in March 2022.  Patient seen in the [facility] emergency room March 22 with complaints of palpitations and noted to have PVCs.  Magnesium 1.9, potassium 4.1, creatinine 0.72.  Recent TSH 2.81.  Since last seen, she has dyspnea on exertion unchanged.  No orthopnea, PND, pedal edema, chest pain or syncope. ? ?Current Outpatient Medications  ?Medication Sig Dispense Refill  ? acetaminophen (TYLENOL) 500 MG tablet Take 1,000 mg by mouth 2 (two) times daily.    ? amLODipine (NORVASC) 5 MG tablet Take 1 tablet (5 mg total) by mouth 2 (two) times daily. 180 tablet 3  ? atorvastatin (LIPITOR) 20 MG tablet Take 20 mg by mouth daily.    ? bisoprolol (ZEBETA) 5 MG tablet TAKE ONE (1) TABLET BY MOUTH EVERY DAY    ? calcium carbonate (TUMS - DOSED IN MG ELEMENTAL CALCIUM) 500 MG chewable tablet Chew 2 tablets by mouth as needed for indigestion or heartburn.    ? COVID-19 mRNA Vac-TriS, Pfizer, (PFIZER-BIONT COVID-19 VAC-TRIS) SUSP injection Inject into the muscle. 0.3 mL 0  ? ELIQUIS 5 MG TABS tablet Take 1 tablet (5 mg total) by mouth 2 (two) times daily. 60 tablet 5  ? fluticasone (FLONASE) 50 MCG/ACT nasal spray     ? Lactobacillus Rhamnosus, GG, (RA PROBIOTIC DIGESTIVE CARE) CAPS Take by mouth.    ? loratadine (CLARITIN) 10 MG tablet Take by mouth.    ? montelukast (SINGULAIR) 10 MG tablet Take by mouth.    ? Multiple Vitamin (MULTIVITAMIN PO) Take by mouth daily.    ? olmesartan (BENICAR) 40 MG tablet Take 1  tablet by mouth daily.    ? omeprazole (PRILOSEC) 20 MG capsule TAKE ONE (1) CAPSULE EACH DAY    ? triamcinolone cream (KENALOG) 0.1 % Apply topically.    ? ?No current facility-administered medications for this visit.  ? ? ? ?Past Medical History:  ?Diagnosis Date  ? Allergy   ? Anemia   ? 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 # 1 seen 01/2018  ? Arthritis   ? shoulders, knees  ? Bronchiectasis ([place])   ? Cataract   ? just watching right eye  ? COLONIC POLYPS, ADENOMATOUS, HX OF 03/27/2009  ? Annotation: 8 mm adenoma Qualifier: Diagnosis of  By: Carlean Purl MD, Dimas Millin   ? Depression   ? Diabetes mellitus without complication ([place])   ? prediabetic - no meds currently  ? GERD (gastroesophageal reflux disease)   ? Hearing loss, bilateral   ? has hearing aids  ? Hyperlipidemia   ? Hypertension   ? Iron deficiency anemia 02/05/2018  ? Wears dentures   ? full upper dentures and partial lower   ? ? ?Past Surgical History:  ?Procedure Laterality Date  ? COLONOSCOPY  05/14/2009  ? Gessner/ adenoma 2007  ? COLONOSCOPY  05/2014  ? Dr Alonza Bogus -[facility] - polyps  ? TOTAL HIP ARTHROPLASTY Right 2017  ? UPPER GASTROINTESTINAL ENDOSCOPY    ? vagiinal hysterectomy  1977  ? WISDOM  TOOTH EXTRACTION    ? ? ?Social History  ? ?Socioeconomic History  ? Marital status: Widowed  ?  Spouse name: Not on file  ? Number of children: 1  ? Years of education: Not on file  ? Highest education level: Not on file  ?Occupational History  ? Not on file  ?Tobacco Use  ? Smoking status: Former  ?  Years: 10.00  ?  Types: Cigarettes  ?  Quit date: 1968  ?  Years since quitting: 55.3  ? Smokeless tobacco: Never  ?Vaping Use  ? Vaping Use: Never used  ?Substance and Sexual Activity  ? Alcohol use: Never  ? Drug use: Never  ? Sexual activity: Not on file  ?Other Topics Concern  ? Not on file  ?Social History Narrative  ? Married  ? Former smoker, no alcohol no drug use  ? ?Social Determinants of Health  ? ?Financial Resource Strain: Not on  file  ?Food Insecurity: Not on file  ?Transportation Needs: Not on file  ?Physical Activity: Not on file  ?Stress: Not on file  ?Social Connections: Not on file  ?Intimate Partner Violence: Not on file  ? ? ?Family History  ?Problem Relation Age of Onset  ? Heart attack Father   ? Heart disease Sister   ? Heart disease Sister   ? Rectal cancer Neg Hx   ? Stomach cancer Neg Hx   ? ? ?ROS: no fevers or chills, productive cough, hemoptysis, dysphasia, odynophagia, melena, hematochezia, dysuria, hematuria, rash, seizure activity, orthopnea, PND, pedal edema, claudication. Remaining systems are negative. ? ?Physical Exam: ?Well-developed well-nourished in no acute distress.  ?Skin is warm and dry.  ?HEENT is normal.  ?Neck is supple.  ?Chest is clear to auscultation with normal expansion.  ?Cardiovascular exam is regular rate and rhythm.  ?Abdominal exam nontender or distended. No masses palpated. ?Extremities show no edema. ?neuro grossly intact ? ?A/P ? ?1 palpitations-recently with palpitations but found to have PVCs.  We will follow.  Continue beta-blocker. ? ?2 paroxysmal atrial fibrillation-patient is in sinus rhythm on examination today.  Continue bisoprolol and apixaban.  Amiodarone discontinued previously due to bronchiectasis.  If she has documented recurrences in the future we will consider Tikosyn versus referral for ablation. ? ?3 hypertension-patient's blood pressure is controlled.  Continue present medical regimen. ? ?4 hyperlipidemia continue statin. ? ?Kirk Ruths, MD ? ? ? ?

## 2021-11-17 ENCOUNTER — Encounter: Payer: Self-pay | Admitting: Cardiology

## 2021-11-17 ENCOUNTER — Ambulatory Visit (INDEPENDENT_AMBULATORY_CARE_PROVIDER_SITE_OTHER): Payer: Medicare Other | Admitting: Cardiology

## 2021-11-17 VITALS — BP 124/60 | HR 67 | Ht 65.0 in | Wt 187.0 lb

## 2021-11-17 DIAGNOSIS — I480 Paroxysmal atrial fibrillation: Secondary | ICD-10-CM

## 2021-11-17 DIAGNOSIS — E7800 Pure hypercholesterolemia, unspecified: Secondary | ICD-10-CM

## 2021-11-17 DIAGNOSIS — R002 Palpitations: Secondary | ICD-10-CM

## 2021-11-17 DIAGNOSIS — I10 Essential (primary) hypertension: Secondary | ICD-10-CM

## 2021-11-17 NOTE — Patient Instructions (Signed)

## 2021-11-30 ENCOUNTER — Emergency Department (INDEPENDENT_AMBULATORY_CARE_PROVIDER_SITE_OTHER)
Admission: EM | Admit: 2021-11-30 | Discharge: 2021-11-30 | Disposition: A | Discharge status: Home / Self Care | Payer: Medicare Other | Source: Home / Self Care | Attending: Family Medicine | Admitting: Family Medicine

## 2021-11-30 ENCOUNTER — Encounter: Payer: Self-pay | Admitting: *Deleted

## 2021-11-30 ENCOUNTER — Emergency Department (INDEPENDENT_AMBULATORY_CARE_PROVIDER_SITE_OTHER): Payer: Medicare Other

## 2021-11-30 DIAGNOSIS — M25552 Pain in left hip: Secondary | ICD-10-CM | POA: Diagnosis not present

## 2021-11-30 DIAGNOSIS — M79602 Pain in left arm: Secondary | ICD-10-CM | POA: Diagnosis not present

## 2021-11-30 DIAGNOSIS — G8911 Acute pain due to trauma: Secondary | ICD-10-CM

## 2021-11-30 DIAGNOSIS — M19012 Primary osteoarthritis, left shoulder: Secondary | ICD-10-CM | POA: Diagnosis not present

## 2021-11-30 DIAGNOSIS — M25512 Pain in left shoulder: Secondary | ICD-10-CM | POA: Diagnosis not present

## 2021-11-30 DIAGNOSIS — W19XXXA Unspecified fall, initial encounter: Secondary | ICD-10-CM

## 2021-11-30 DIAGNOSIS — Y92009 Unspecified place in unspecified non-institutional (private) residence as the place of occurrence of the external cause: Secondary | ICD-10-CM

## 2021-11-30 MED ORDER — TRAMADOL HCL 50 MG PO TABS: 50.0000 mg | ORAL_TABLET | Freq: Four times a day (QID) | ORAL | 0 refills | Status: DC | PRN

## 2021-11-30 NOTE — ED Provider Notes (Signed)
[name] [name]    CSN: [id number] Arrival date & time: 11/30/21  5427      History   Chief Complaint Chief Complaint  Patient presents with   Fall   Hip Pain    Left    Shoulder Pain    left    HPI [name] [name] is a 81 y.o. female.   HPI  [name] [name] states she got up on her tiptoes to pull back a curtain look at a window when she lost her balance and fell over her left side.  She states that she has pain in her left hip and in her left shoulder.  Points to her proximal humerus.  She has pain with weightbearing.  She has pain with movement of her arm.  No numbness.  She is with weightbearing on the left leg.  She is using a cane. Patient states that she has osteopenia but not osteoporosis.  She has not had fractures in the past. Patient is on Eliquis for her heart.  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   Hyperlipidemia    Hypertension    Iron deficiency anemia 02/05/2018   Wears dentures    full upper dentures and partial lower     Patient Active Problem List   Diagnosis Date Noted   Iron deficiency anemia 02/05/2018   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      Home Medications    Prior to Admission medications   Medication Sig Start Date End Date Taking? Authorizing Provider  traMADol (ULTRAM) 50 MG tablet Take 1 tablet (50 mg total) by mouth every 6 (six) hours as needed. 11/30/21  Yes Eustace Moore, MD  acetaminophen (TYLENOL) 500 MG tablet Take 1,000 mg by mouth 2 (two) times daily.    [provider]  amLODipine (NORVASC) 5 MG tablet Take 1 tablet (5 mg total) by mouth 2 (two) times daily. 09/23/21   Lewayne Bunting, MD  atorvastatin (LIPITOR) 20 MG tablet Take 20 mg by mouth daily. 08/06/21   [provider]  bisoprolol (ZEBETA) 5 MG tablet TAKE ONE (1) TABLET BY MOUTH EVERY DAY 08/14/14   [provider]  calcium carbonate (TUMS - DOSED IN MG ELEMENTAL CALCIUM) 500 MG chewable tablet Chew 2 tablets by mouth  as needed for indigestion or heartburn.    [provider]  ELIQUIS 5 MG TABS tablet Take 1 tablet (5 mg total) by mouth 2 (two) times daily. 10/04/21   Lewayne Buntingrenshaw, Brian S, MD  fluticasone Aleda Grana(FLONASE) 50 MCG/ACT nasal spray  01/12/18   [provider]  Lactobacillus Rhamnosus, GG, (RA PROBIOTIC DIGESTIVE [name]) CAPS Take by mouth.    [provider]  loratadine (CLARITIN) 10 MG tablet Take by mouth. 10/30/17   [provider]  montelukast (SINGULAIR) 10 MG tablet Take by mouth.    [provider]  Multiple Vitamin (MULTIVITAMIN PO) Take by mouth daily.    [provider]  olmesartan (BENICAR) 40 MG tablet Take 1 tablet by mouth daily. 08/16/21   [provider]  omeprazole (PRILOSEC) 20 MG capsule TAKE ONE (1) CAPSULE EACH DAY 08/14/14   [provider]  triamcinolone cream (KENALOG) 0.1 % Apply  topically. 06/15/20   [provider]    Family History Family History  Problem Relation Age of Onset   Heart attack Father    Heart disease Sister    Heart disease Sister    Rectal cancer Neg Hx    Stomach cancer Neg Hx     Social History Social History   Tobacco Use   Smoking status: Former    Years: 10.00    Types: Cigarettes    Quit date: 1968    Years since quitting: 55.4   Smokeless tobacco: Never  Vaping Use   Vaping Use: Never used  Substance Use Topics   Alcohol use: Never   Drug use: Never     Allergies   Cold relief plus  [chlorphen-phenyleph-asa], Ace inhibitors, Codeine, Reglan [metoclopramide], and Epinephrine   Review of Systems Review of Systems See HPI  Physical Exam Triage Vital Signs ED Triage Vitals [11/30/21 0938]  Enc Vitals Group     BP (!) 147/70     Pulse Rate 63     Resp 18     Temp      Temp src      SpO2 97 %     Weight      Height      Head Circumference      Peak Flow      Pain Score      Pain Loc      Pain Edu?      Excl. in [place]?    No data found.  Updated Vital Signs BP (!) 147/70 (BP Location: Right Arm)   Pulse 63   Resp 18   SpO2 97%       Physical Exam Constitutional:      General: She is not in acute distress.    Appearance: She is well-developed.     Comments: Overweight.  Uncomfortable  HENT:     Head: Normocephalic and atraumatic.  Eyes:     Conjunctiva/sclera: Conjunctivae normal.     Pupils: Pupils are equal, round, and reactive to light.  Cardiovascular:     Rate and Rhythm: Normal rate.  Pulmonary:     Effort: Pulmonary effort is normal. No respiratory distress.  Abdominal:     General: There is no distension.     Palpations: Abdomen is soft.  Musculoskeletal:        General: Tenderness present. Normal range of motion.     Cervical back: Normal range of motion.     Comments: Patient has tenderness in her proximal humerus.  She can forward flex about  30 degrees abduct 45 degrees.   Pain with palpation of area no visible bruising or swelling.  Tenderness over the left anterior superior iliac spine and greater trochanter.  Limited range of motion of the hip.  Mild tenderness over the left SI region.  No bruising noted.  No leg length discrepancy or rotation  Skin:    General: Skin is warm and dry.  Neurological:     General: No focal deficit present.     Mental Status: She is alert.     Gait: Gait abnormal.  Psychiatric:        Mood and Affect: Mood normal.        Behavior: Behavior normal.     UC Treatments / Results  Labs (all labs ordered are listed, but only abnormal results are displayed) Labs Reviewed - No data to display  EKG   Radiology DG Humerus Left  Result Date: 11/30/2021 CLINICAL DATA:  Left arm pain after fall. EXAM: LEFT HUMERUS - 2+ VIEW COMPARISON:  Left shoulder x-rays dated February 16, 2017. FINDINGS: No acute fracture or dislocation. Progressive severe left glenohumeral osteoarthritis with remodeling and flattening of the humeral head and glenoid. Soft tissues are unremarkable. IMPRESSION: 1. No acute osseous abnormality. 2. Progressive severe left glenohumeral osteoarthritis. Electronically Signed   By: Obie Dredge M.D.   On: 11/30/2021 10:26   DG Hip Unilat W or Wo Pelvis 2-3 Views Left  Result Date: 11/30/2021 CLINICAL DATA:  Left hip pain after fall. EXAM: DG HIP (WITH OR WITHOUT PELVIS) 2-3V LEFT COMPARISON:  CT abdomen pelvis dated December 30, 2020. FINDINGS: No acute fracture or dislocation. The left hip joint space is relatively preserved. Prior right total hip arthroplasty. No evidence of hardware failure or loosening. Soft tissues are unremarkable. IMPRESSION: 1. No acute osseous abnormality. Electronically Signed   By: Obie Dredge M.D.   On: 11/30/2021 10:23    Procedures Procedures (including critical [name] time)  Medications Ordered in UC Medications - No data to display  Initial Impression / Assessment and Plan / UC Course   I have reviewed the triage vital signs and the nursing notes.  Pertinent labs & imaging results that were available during my [name] of the patient were reviewed by me and considered in my medical decision making (see chart for details).      Final Clinical Impressions(s) / UC Diagnoses   Final diagnoses:  Acute hip pain, left  Acute shoulder pain due to trauma, left  Fall at home, initial encounter  Arthritis of left glenohumeral joint     Discharge Instructions      Limit walking and use of left arm while it is painful Use ice for the first 24 hours to reduce swelling and bleeding, after this you may use heat Take over-the-counter Tylenol for mild pain Take tramadol for severe pain Follow-up with your doctor if not improving in a week     ED Prescriptions     Medication Sig Dispense Auth. Provider   traMADol (ULTRAM) 50 MG tablet Take 1 tablet (50 mg total) by mouth every 6 (six) hours as needed. 15 tablet Eustace Moore, MD      I have reviewed the PDMP during this encounter.   Eustace Moore, MD 11/30/21 1040

## 2021-11-30 NOTE — Discharge Instructions (Signed)
Limit walking and use of left arm while it is painful Use ice for the first 24 hours to reduce swelling and bleeding, after this you may use heat Take over-the-counter Tylenol for mild pain Take tramadol for severe pain Follow-up with your doctor if not improving in a week

## 2021-11-30 NOTE — ED Triage Notes (Signed)
Patient reports losing her balance this AM while reaching for something up high in her kitchen. She reports she fell onto her left side. Denies loss of consciousness or hitting her head. She is on Eliquis. She has applied heat this AM and taken tylenol.

## 2021-12-17 ENCOUNTER — Encounter: Payer: Self-pay | Admitting: Cardiology

## 2022-01-04 ENCOUNTER — Telehealth: Payer: Self-pay | Admitting: Internal Medicine

## 2022-01-06 NOTE — Telephone Encounter (Signed)
Patient is aware of Dr. Marvell Fuller recommendations.

## 2022-01-23 ENCOUNTER — Encounter: Payer: Self-pay | Admitting: Cardiology

## 2022-02-07 ENCOUNTER — Telehealth: Payer: Self-pay | Admitting: Cardiology

## 2022-02-07 NOTE — Telephone Encounter (Signed)
Pt c/o medication issue:  1. Name of Medication: ELIQUIS 5 MG TABS tablet  2. How are you currently taking this medication (dosage and times per day)?   3. Are you having a reaction (difficulty breathing--STAT)? no  4. What is your medication issue? [name] from [facility] called stating that pt was seen in the office because she's has been bleeding. She states that pt is currently off of eliquis and wants to know does she need to restart it or wait until they do hemorrhoidal banding on her. Please advise.   Best call back - [phone]

## 2022-02-07 NOTE — Telephone Encounter (Signed)
Attempted to call [name] at [facility] again, unable to reach or leave message at this time.

## 2022-02-07 NOTE — Telephone Encounter (Signed)
Will forward to MD for review and advise.

## 2022-02-07 NOTE — Telephone Encounter (Signed)
Attempted to call [name] [name] at [facility], unable to reach or leave message at this time.   Called and spoke with patient, and made her aware of Dr. Ludwig Clarks recommendations. Patient states that she also wanted to make Dr. Jens Som aware that she has been off  the Eliquis since July 17th and that GI told her today that it could be 2 weeks or more before the banding is completed. Advised patient I would make Dr. Jens Som aware. Patient verbalized understanding.   Lewayne Bunting, MD  You 17 minutes ago (11:49 AM)    Molli Knock to hold apixaban until hemorrhoidal banding completed.  Then resume at previous dose.  Olga Millers

## 2022-02-09 NOTE — Telephone Encounter (Signed)
Spoke with pt, Aware of dr Ludwig Clarks recommendations. She is having banding on the 16th.

## 2022-02-09 NOTE — Telephone Encounter (Signed)
Patient is returning call. Please advise? 

## 2022-02-09 NOTE — Telephone Encounter (Signed)
Left message for pt to call.

## 2022-02-16 ENCOUNTER — Encounter: Payer: Self-pay | Admitting: Cardiology

## 2022-02-16 NOTE — Telephone Encounter (Signed)
Sen to Dr Jens Som for review and response

## 2022-02-20 ENCOUNTER — Encounter: Payer: Self-pay | Admitting: Cardiology

## 2022-02-23 ENCOUNTER — Encounter: Payer: Self-pay | Admitting: Cardiology

## 2022-03-07 ENCOUNTER — Encounter: Payer: Self-pay | Admitting: Cardiology

## 2022-03-23 ENCOUNTER — Ambulatory Visit: Payer: Medicare Other | Admitting: Cardiology

## 2022-04-05 ENCOUNTER — Encounter: Payer: Self-pay | Admitting: Cardiology

## 2022-04-12 ENCOUNTER — Encounter: Payer: Self-pay | Admitting: Cardiology

## 2022-04-12 MED ORDER — BISOPROLOL FUMARATE 5 MG PO TABS: ORAL_TABLET | ORAL | 2 refills | Status: DC

## 2022-04-13 ENCOUNTER — Encounter: Payer: Self-pay | Admitting: Emergency Medicine

## 2022-04-13 ENCOUNTER — Ambulatory Visit
Admission: EM | Admit: 2022-04-13 | Discharge: 2022-04-13 | Disposition: A | Discharge status: Home / Self Care | Payer: Medicare Other | Attending: Family Medicine | Admitting: Family Medicine

## 2022-04-13 ENCOUNTER — Other Ambulatory Visit: Payer: Self-pay

## 2022-04-13 ENCOUNTER — Ambulatory Visit (INDEPENDENT_AMBULATORY_CARE_PROVIDER_SITE_OTHER): Payer: Medicare Other

## 2022-04-13 DIAGNOSIS — M4722 Other spondylosis with radiculopathy, cervical region: Secondary | ICD-10-CM | POA: Diagnosis not present

## 2022-04-13 DIAGNOSIS — R202 Paresthesia of skin: Secondary | ICD-10-CM | POA: Diagnosis not present

## 2022-04-13 DIAGNOSIS — K219 Gastro-esophageal reflux disease without esophagitis: Secondary | ICD-10-CM

## 2022-04-13 DIAGNOSIS — R200 Anesthesia of skin: Secondary | ICD-10-CM | POA: Diagnosis not present

## 2022-04-13 DIAGNOSIS — M542 Cervicalgia: Secondary | ICD-10-CM | POA: Diagnosis not present

## 2022-04-13 NOTE — Discharge Instructions (Signed)
The intermittent numbness in your left hand is due to a pinched nerve.  You have disc problems in your neck. No evidence of problems with your heart. Continue omeprazole for your heartburn If your hand numbness persists, see your primary care doctor

## 2022-04-13 NOTE — ED Provider Notes (Signed)
[name]    CSN: [id number] Arrival date & time: 04/13/22  1006      History   Chief Complaint Chief Complaint  Patient presents with   Arm Pain    HPI [name] [name] is a 81 y.o. female.   HPI  Patient states that last night she had some gas pains.  She took Tums.  That helped with the pressure.  It was in her upper abdomen and chest area.  It is gone.  She also noted when she was in bed last night she had some numbness in her left hand and the fifth finger.  She states this will come and go.  It seems to be positional.  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  No injury or accident.  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   Hyperlipidemia    Hypertension    Iron deficiency anemia 02/05/2018   Wears dentures    full upper dentures and partial lower     Patient Active Problem List   Diagnosis Date Noted   Iron deficiency anemia 02/05/2018   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      Home Medications    Prior to Admission medications   Medication Sig Start Date End Date Taking? Authorizing Provider  acetaminophen (TYLENOL) 500 MG tablet Take 1,000 mg by mouth 2 (two) times daily.    [provider]  amLODipine (NORVASC) 5 MG tablet Take 1 tablet (5 mg total) by mouth 2 (two) times daily. 09/23/21   Lewayne Bunting, MD  atorvastatin (LIPITOR) 20 MG tablet Take 20 mg by mouth daily. 08/06/21   [provider]  bisoprolol (ZEBETA) 5 MG tablet TAKE ONE (1) TABLET BY MOUTH EVERY DAY 04/12/22   Lewayne Bunting, MD  calcium carbonate (TUMS - DOSED IN MG ELEMENTAL CALCIUM) 500 MG chewable tablet Chew 2 tablets by mouth as needed for indigestion or heartburn.    [provider]  ELIQUIS 5 MG TABS tablet Take 1 tablet (5 mg total) by mouth 2 (two) times daily. 10/04/21   Lewayne Bunting, MD  fluticasone Aleda Grana) 50 MCG/ACT nasal spray  01/12/18   [provider]  Lactobacillus Rhamnosus, GG, (RA PROBIOTIC DIGESTIVE [facility]ARE) CAPS Take by mouth.  [provider]  loratadine (CLARITIN) 10 MG tablet Take by mouth. 10/30/17   [provider]  montelukast (SINGULAIR) 10 MG tablet Take by mouth.    [provider]  Multiple Vitamin (MULTIVITAMIN PO) Take by mouth daily.    [provider]  olmesartan (BENICAR) 40 MG tablet Take 1 tablet by mouth daily. 08/16/21   [provider]  omeprazole (PRILOSEC) 20 MG capsule TAKE ONE (1) CAPSULE EACH DAY 08/14/14   [provider]  traMADol (ULTRAM) 50 MG tablet Take 1 tablet (50 mg total) by mouth every 6 (six) hours as needed. 11/30/21   Raylene Everts, MD  triamcinolone cream (KENALOG) 0.1 % Apply topically. 06/15/20   [provider]    Family History Family History  Problem Relation Age of Onset   Heart attack Father    Heart disease Sister    Heart disease Sister    Rectal cancer Neg Hx     Stomach cancer Neg Hx     Social History Social History   Tobacco Use   Smoking status: Former    Years: 10.00    Types: Cigarettes    Quit date: 1968    Years since quitting: 55.7   Smokeless tobacco: Never  Vaping Use   Vaping Use: Never used  Substance Use Topics   Alcohol use: Never   Drug use: Never     Allergies   Cold relief plus  [chlorphen-phenyleph-asa], Ace inhibitors, Codeine, Reglan [metoclopramide], and Epinephrine   Review of Systems Review of Systems See HPI  Physical Exam Triage Vital Signs ED Triage Vitals  Enc Vitals Group     BP 04/13/22 1017 122/62     Pulse Rate 04/13/22 1017 64     Resp 04/13/22 1017 16     Temp 04/13/22 1017 97.8 F (36.6 C)     Temp Source 04/13/22 1017 Oral     SpO2 04/13/22 1017 98 %     Weight --      Height --      Head Circumference --      Peak Flow --      Pain Score 04/13/22 1020 0     Pain Loc --      Pain Edu? --      Excl. in [place]? --    No data found.  Updated Vital Signs BP 122/62 (BP Location: Right Arm)   Pulse 64   Temp 97.8 F (36.6 C) (Oral)   Resp 16   SpO2 98%      Physical Exam Constitutional:      General: She is not in acute distress.    Appearance: She is well-developed and normal weight.  HENT:     Head: Normocephalic and atraumatic.     Right Ear: Tympanic membrane and ear canal normal.     Left Ear: Tympanic membrane and ear canal normal.     Nose: Nose normal. No congestion.     Mouth/Throat:     Mouth: Mucous membranes are moist.     Pharynx: No posterior oropharyngeal erythema.  Eyes:     Conjunctiva/sclera: Conjunctivae normal.     Pupils: Pupils are equal, round, and reactive to light.  Neck:     Vascular: No carotid bruit.  Cardiovascular:     Rate and Rhythm: Normal rate and regular rhythm.     Heart sounds: Normal heart sounds.  Pulmonary:     Effort: Pulmonary effort is normal. No respiratory distress.  Breath sounds: Normal breath sounds.  Abdominal:      General: There is no distension.     Palpations: Abdomen is soft.  Musculoskeletal:        General: Normal range of motion.     Cervical back: Normal range of motion.     Comments: Slow but full range of motion of neck.  Negative Spurling's.  No tenderness in Guyon's canal  Lymphadenopathy:     Cervical: No cervical adenopathy.  Skin:    General: Skin is warm and dry.  Neurological:     Mental Status: She is alert.     Cranial Nerves: No cranial nerve deficit.     Sensory: No sensory deficit.     Motor: No weakness.     Gait: Gait normal.     Deep Tendon Reflexes: Reflexes normal.      UC Treatments / Results  Labs (all labs ordered are listed, but only abnormal results are displayed) Labs Reviewed - No data to display  EKG   Radiology DG Cervical Spine Complete  Result Date: 04/13/2022 CLINICAL DATA:  Left-sided neck pain EXAM: CERVICAL SPINE - COMPLETE 4+ VIEW COMPARISON:  None Available. FINDINGS: There is no evidence of cervical spine fracture or prevertebral soft tissue swelling. Straightening of the cervical lordosis. Minimal grade 1 anterolisthesis of C4 on C5. Moderate disc height loss at C5-6. Multilevel bilateral facet and uncovertebral arthropathy with bony foraminal narrowing most pronounced bilaterally at C5-6. Advanced degenerative changes of the bilateral glenohumeral joints, incompletely imaged. IMPRESSION: 1. No acute fracture of the cervical spine. 2. Multilevel degenerative changes of the cervical spine most pronounced at C5-6. Electronically Signed   By: Duanne Guess D.O.   On: 04/13/2022 11:06    Procedures Procedures (including critical care time)  Medications Ordered in UC Medications - No data to display  Initial Impression / Assessment and Plan / UC Course  I have reviewed the triage vital signs and the nursing notes.  Pertinent labs & imaging results that were available during my care of the patient were reviewed by me and considered in my  medical decision making (see chart for details).     Patient has known GERD.  She states she saw improvement after using Rolaids.  She does not know why her hand has been going numb.  She worries with left hand numbness that it could be heart problems.  Clearly her pain is in a radicular distribution.  She has cervical DJD Final Clinical Impressions(s) / UC Diagnoses   Final diagnoses:  Numbness and tingling in left hand  Osteoarthritis of spine with radiculopathy, cervical region  Gastroesophageal reflux disease, unspecified whether esophagitis present     Discharge Instructions      The intermittent numbness in your left hand is due to a pinched nerve.  You have disc problems in your neck. No evidence of problems with your heart. Continue omeprazole for your heartburn If your hand numbness persists, see your primary care doctor   ED Prescriptions   None    PDMP not reviewed this encounter.   Eustace Moore, MD 04/13/22 [phone]

## 2022-04-13 NOTE — ED Triage Notes (Signed)
Pt states she developed gas last night and in the middle of the night had left sided arm pain. Denies chest pain. She took tums with some relief.

## 2022-04-14 ENCOUNTER — Telehealth: Payer: Self-pay

## 2022-04-14 NOTE — Telephone Encounter (Signed)
TCT pt to follow up from recent visit. HIPAA compliant VM left for return call.  

## 2022-04-25 ENCOUNTER — Encounter: Payer: Self-pay | Admitting: Cardiology

## 2022-04-25 ENCOUNTER — Telehealth: Payer: Self-pay | Admitting: Cardiology

## 2022-04-25 MED ORDER — RIVAROXABAN 20 MG PO TABS: 20.0000 mg | ORAL_TABLET | Freq: Every day | ORAL | 3 refills | Status: DC

## 2022-04-25 NOTE — Telephone Encounter (Signed)
Spoke to patient she stated she calling about email she sent earlier to [facility].Dr.Crenshaw advised to stop Eliquis.Start Xarelto 20 mg daily with supper.Advised to call back if she is not better.

## 2022-04-25 NOTE — Telephone Encounter (Signed)
Spoke to patient Dr.Crenshaw's advice given.She will stop taking Eliquis.Start Xarelto 20 mg daily with supper.Advised to call back if not better.

## 2022-04-25 NOTE — Telephone Encounter (Signed)
Patient is calling about the MyChart message she sent today.

## 2022-04-27 NOTE — Progress Notes (Deleted)
HPI: FU atrial fibrillation.  Patient previously followed at [facility].  She had an episode of atrial fibrillation and underwent cardioversion October 01, 2020 and has been treated with amiodarone.  Amiodarone was discontinued by pulmonary ultimately due to history of bronchiectasis.  Echocardiogram January 2022 showed normal LV function.  Monitor January 2023 showed sinus rhythm with brief runs of PAT, PACs and PVCs and 1 triplet.  Patient has had no recurrent atrial fibrillation since her cardioversion in March 2022.  Patient seen in the [facility] emergency room March 22 with complaints of palpitations and noted to have PVCs. Since last seen,   Current Outpatient Medications  Medication Sig Dispense Refill   acetaminophen (TYLENOL) 500 MG tablet Take 1,000 mg by mouth 2 (two) times daily.     amLODipine (NORVASC) 5 MG tablet Take 1 tablet (5 mg total) by mouth 2 (two) times daily. 180 tablet 3   atorvastatin (LIPITOR) 20 MG tablet Take 20 mg by mouth daily.     bisoprolol (ZEBETA) 5 MG tablet TAKE ONE (1) TABLET BY MOUTH EVERY DAY 90 tablet 2   calcium carbonate (TUMS - DOSED IN MG ELEMENTAL CALCIUM) 500 MG chewable tablet Chew 2 tablets by mouth as needed for indigestion or heartburn.     fluticasone (FLONASE) 50 MCG/ACT nasal spray      Lactobacillus Rhamnosus, GG, (RA PROBIOTIC DIGESTIVE CARE) CAPS Take by mouth.     loratadine (CLARITIN) 10 MG tablet Take by mouth.     montelukast (SINGULAIR) 10 MG tablet Take by mouth.     Multiple Vitamin (MULTIVITAMIN PO) Take by mouth daily.     olmesartan (BENICAR) 40 MG tablet Take 1 tablet by mouth daily.     omeprazole (PRILOSEC) 20 MG capsule TAKE ONE (1) CAPSULE EACH DAY     rivaroxaban (XARELTO) 20 MG TABS tablet Take 1 tablet (20 mg total) by mouth daily with supper. 90 tablet 3   traMADol (ULTRAM) 50 MG tablet Take 1 tablet (50 mg total) by mouth every 6 (six) hours as needed. 15 tablet 0   triamcinolone cream (KENALOG) 0.1 % Apply  topically.     No current facility-administered medications for this visit.     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   Hyperlipidemia    Hypertension    Iron deficiency anemia 02/05/2018   Wears dentures    full upper dentures and partial lower     Past Surgical History:  Procedure Laterality Date   COLONOSCOPY  05/14/2009   Gessner/ adenoma 2007   COLONOSCOPY  05/2014   Dr Alonza Bogus -[facility] - polyps   TOTAL HIP ARTHROPLASTY Right 2017   UPPER GASTROINTESTINAL ENDOSCOPY     vagiinal hysterectomy  1977   WISDOM TOOTH EXTRACTION      Social History   Socioeconomic History   Marital status: Widowed  Spouse name: Not on file   Number of children: 1   Years of education: Not on file   Highest education level: Not on file  Occupational History   Not on file  Tobacco Use   Smoking status: Former    Years: 10.00    Types: Cigarettes    Quit date: 65    Years since quitting: 55.8   Smokeless tobacco: Never  Vaping Use   Vaping Use: Never used  Substance and Sexual Activity   Alcohol use: Never   Drug use: Never   Sexual activity: Not on file  Other Topics Concern   Not on file  Social History Narrative   Married   Former smoker, no alcohol no drug use   Social Determinants of Corporate investment banker Strain: Not on file  Food Insecurity: Not on file  Transportation Needs: Not on file  Physical Activity: Not on file  Stress: Not on file  Social  Connections: Not on file  Intimate Partner Violence: Not on file    Family History  Problem Relation Age of Onset   Heart attack Father    Heart disease Sister    Heart disease Sister    Rectal cancer Neg Hx    Stomach cancer Neg Hx     ROS: no fevers or chills, productive cough, hemoptysis, dysphasia, odynophagia, melena, hematochezia, dysuria, hematuria, rash, seizure activity, orthopnea, PND, pedal edema, claudication. Remaining systems are negative.  Physical Exam: Well-developed well-nourished in no acute distress.  Skin is warm and dry.  HEENT is normal.  Neck is supple.  Chest is clear to auscultation with normal expansion.  Cardiovascular exam is regular rate and rhythm.  Abdominal exam nontender or distended. No masses palpated. Extremities show no edema. neuro grossly intact  ECG- personally reviewed  A/P  1 paroxysmal atrial fibrillation-patient remains in sinus rhythm.  Continue bisoprolol and apixaban.  Note amiodarone was discontinued previously due to bronchiectasis.  If she has recurrences in the future we can consider referral for Tikosyn versus ablation.  2 palpitations-continue beta-blocker.  3 hypertension-blood pressure controlled.  Continue present medications and follow.  4 hyperlipidemia-continue statin.  Olga Millers, MD

## 2022-05-02 ENCOUNTER — Encounter: Payer: Self-pay | Admitting: Cardiology

## 2022-05-03 ENCOUNTER — Telehealth: Payer: Self-pay | Admitting: Pharmacist Clinician (PhC)/ Clinical Pharmacy Specialist

## 2022-05-03 ENCOUNTER — Encounter: Payer: Self-pay | Admitting: Cardiology

## 2022-05-03 NOTE — Telephone Encounter (Signed)
error 

## 2022-05-03 NOTE — Telephone Encounter (Signed)
Pradaxa is an option, price is starting to drop now that it's generic and the mechanism of action is different.   When I reviewed her drug list for interactions, had one site (up To Date) suggest amlodipine and bisoprolol could potentially affect Pradaxa concentration. One would increase it, the other decrease.  However two other sites suggested there was no interaction.    If you don't want to go that route we can get her set up for a new Coumadin appointment.

## 2022-05-04 ENCOUNTER — Encounter: Payer: Self-pay | Admitting: Cardiology

## 2022-05-05 MED ORDER — APIXABAN 5 MG PO TABS: 5.0000 mg | ORAL_TABLET | Freq: Two times a day (BID) | ORAL | 3 refills | Status: DC

## 2022-05-05 NOTE — Telephone Encounter (Signed)
See duplicate mychart message 

## 2022-05-09 ENCOUNTER — Encounter: Payer: Self-pay | Admitting: Cardiology

## 2022-05-09 ENCOUNTER — Ambulatory Visit (INDEPENDENT_AMBULATORY_CARE_PROVIDER_SITE_OTHER): Payer: Medicare Other | Admitting: Cardiology

## 2022-05-09 VITALS — BP 107/67 | HR 66 | Ht 65.0 in | Wt 188.4 lb

## 2022-05-09 DIAGNOSIS — R002 Palpitations: Secondary | ICD-10-CM | POA: Diagnosis not present

## 2022-05-09 DIAGNOSIS — I480 Paroxysmal atrial fibrillation: Secondary | ICD-10-CM | POA: Diagnosis not present

## 2022-05-09 DIAGNOSIS — I10 Essential (primary) hypertension: Secondary | ICD-10-CM

## 2022-05-09 DIAGNOSIS — E7800 Pure hypercholesterolemia, unspecified: Secondary | ICD-10-CM | POA: Diagnosis not present

## 2022-05-09 MED ORDER — BISOPROLOL FUMARATE 10 MG PO TABS: 5.0000 mg | ORAL_TABLET | Freq: Every day | ORAL | 3 refills | Status: DC

## 2022-05-09 NOTE — Progress Notes (Signed)
HPI: FU atrial fibrillation.  Patient previously followed at [facility].  She had an episode of atrial fibrillation and underwent cardioversion October 01, 2020 and has been treated with amiodarone.  Amiodarone was discontinued by pulmonary ultimately due to history of bronchiectasis.  Echocardiogram January 2022 showed normal LV function.  Monitor January 2023 showed sinus rhythm with brief runs of PAT, PACs and PVCs and 1 triplet.  Patient has had no recurrent atrial fibrillation since her cardioversion in March 2022.  Patient seen in the [facility] emergency room March 22 with complaints of palpitations and noted to have PVCs.  Patient has contacted the office multiple times recently due to perceived side effects from apixaban and Xarelto.  Patient seen at [facility] October 29 with recurrent atrial fibrillation.  Since last seen, patient states she went out of rhythm yesterday morning.  She felt palpitations and some increased dyspnea.  There is no orthopnea, PND, pedal edema, chest pain or syncope.  Current Outpatient Medications  Medication Sig Dispense Refill   acetaminophen (TYLENOL) 500 MG tablet Take 1,000 mg by mouth 2 (two) times daily.     amLODipine (NORVASC) 5 MG tablet Take 5 mg by mouth daily. 1/2 tablet in morning and 1 tablet at night     apixaban (ELIQUIS) 5 MG TABS tablet Take 1 tablet (5 mg total) by mouth 2 (two) times daily. 60 tablet 3   atorvastatin (LIPITOR) 20 MG tablet Take 20 mg by mouth daily.     bisoprolol (ZEBETA) 5 MG tablet Take 5 mg by mouth daily.     calcium carbonate (TUMS - DOSED IN MG ELEMENTAL CALCIUM) 500 MG chewable tablet Chew 2 tablets by mouth as needed for indigestion or heartburn.     fluticasone (FLONASE) 50 MCG/ACT nasal spray      Multiple Vitamin (MULTIVITAMIN PO) Take by mouth daily.     olmesartan (BENICAR) 40 MG tablet Take 20 mg by mouth in the morning and at bedtime.     omeprazole (PRILOSEC) 20 MG capsule TAKE ONE (1) CAPSULE EACH DAY      triamcinolone cream (KENALOG) 0.1 % Apply 1 Application topically as directed.     montelukast (SINGULAIR) 10 MG tablet Take by mouth.     No current facility-administered medications for this visit.     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   Hyperlipidemia    Hypertension    Iron deficiency anemia 02/05/2018   Wears dentures    full upper dentures and partial lower     Past Surgical History:  Procedure Laterality Date   COLONOSCOPY  05/14/2009   Gessner/ adenoma 2007   COLONOSCOPY  05/2014   Dr Alonza Bogus -[facility] - polyps   TOTAL HIP ARTHROPLASTY Right 2017   UPPER GASTROINTESTINAL ENDOSCOPY     vagiinal hysterectomy  1977   WISDOM TOOTH EXTRACTION  Social History   Socioeconomic History   Marital status: Widowed    Spouse name: Not on file   Number of children: 1   Years of education: Not on file   Highest education level: Not on file  Occupational History   Not on file  Tobacco Use   Smoking status: Former    Years: 10.00    Types: Cigarettes    Quit date: 33    Years since quitting: 55.8   Smokeless tobacco: Never  Vaping Use   Vaping Use: Never used  Substance and Sexual Activity   Alcohol use: Never   Drug use: Never   Sexual activity: Not on file  Other Topics Concern   Not on file  Social History Narrative   Married   Former smoker, no alcohol no drug use   Social Determinants of Radio broadcast assistant Strain: Not on file  Food  Insecurity: Not on file  Transportation Needs: Not on file  Physical Activity: Not on file  Stress: Not on file  Social Connections: Not on file  Intimate Partner Violence: Not on file    Family History  Problem Relation Age of Onset   Heart attack Father    Heart disease Sister    Heart disease Sister    Rectal cancer Neg Hx    Stomach cancer Neg Hx     ROS: no fevers or chills, productive cough, hemoptysis, dysphasia, odynophagia, melena, hematochezia, dysuria, hematuria, rash, seizure activity, orthopnea, PND, pedal edema, claudication. Remaining systems are negative.  Physical Exam: Well-developed well-nourished in no acute distress.  Skin is warm and dry.  HEENT is normal.  Neck is supple.  Chest is clear to auscultation with normal expansion.  Cardiovascular exam is irregular Abdominal exam nontender or distended. No masses palpated. Extremities show no edema. neuro grossly intact  ECG-atrial fibrillation at a rate of 100, left ventricular hypertrophy, nonspecific ST changes.  Personally reviewed  A/P  1 paroxysmal atrial fibrillation-patient has developed recurrent atrial fibrillation and is symptomatic with palpitations and increased dyspnea on exertion.  Increase bisoprolol to 10 mg daily.  Continue apixaban and she has missed no doses in the past 48 hours.  Her atrial fibrillation clearly began 24 hours ago.  We will arrange outpatient cardioversion.  She will then follow-up in the atrial fibrillation clinic as she will likely need an antiarrhythmic to maintain sinus rhythm.  She had been on amiodarone in the past but this was discontinued due to potential lung side effects.  Tikosyn may be best option.  2 hypertension-patient's blood pressure is borderline today.  Discontinue amlodipine given we are increasing bisoprolol to 10 mg daily.  Follow blood pressure and adjust regimen as needed.  3 hyperlipidemia-continue statin.  4 history of palpitations-continue  beta-blocker.  Kirk Ruths, MD

## 2022-05-09 NOTE — Patient Instructions (Signed)
Medication Instructions:   STOP AMLODIPINE  INCREASE BISOPROLOL TO 10 MG ONCE DAILY= 2 OF THE 5 MG TABLETS ONCE DAILY  *If you need a refill on your cardiac medications before your next appointment, please call your pharmacy*   Testing/Procedures:   You are scheduled for a Cardioversion on Tuesday, October 31 with Dr. Radford Pax.  Please arrive at the [facility] (Main Entrance A) at [facility]: [address] [place], [place] [address] at 1:15 PM.   DIET:  Nothing to eat or drink after midnight except a sip of water with medications (see medication instructions below)  MEDICATION INSTRUCTIONS:    Continue taking your anticoagulant (blood thinner): Apixaban (Eliquis).  You will need to continue this after your procedure until you are told by your provider that it is safe to stop.    FYI:  For your safety, and to allow [place] to monitor your vital signs accurately during the surgery/procedure we request: If you have artificial nails, gel coating, SNS etc, please have those removed prior to your surgery/procedure. Not having the nail coverings /polish removed may result in cancellation or delay of your surgery/procedure.  You must have a responsible person to drive you home and stay in the waiting area during your procedure. Failure to do so could result in cancellation.  Bring your insurance cards.  *Special Note: Every effort is made to have your procedure done on time. Occasionally there are emergencies that occur at the hospital that may cause delays. Please be patient if a delay does occur.    Follow-Up: At [facility], you and your health needs are our priority.  As part of our continuing mission to provide you with exceptional heart care, we have created designated Provider Care Teams.  These Care Teams include your primary Cardiologist (physician) and Advanced Practice Providers (APPs -  Physician Assistants and Nurse Practitioners) who all work together to provide  you with the care you need, when you need it.  We recommend signing up for the patient portal called "MyChart".  Sign up information is provided on this After Visit Summary.  MyChart is used to connect with patients for Virtual Visits (Telemedicine).  Patients are able to view lab/test results, encounter notes, upcoming appointments, etc.  Non-urgent messages can be sent to your provider as well.   To learn more about what you can do with MyChart, go to [email or web address].    Your next appointment:   8 week(s)  The format for your next appointment:   In Person  Provider:   Kirk Ruths, MD

## 2022-05-09 NOTE — H&P (View-Only) (Signed)
HPI: FU atrial fibrillation.  Patient previously followed at [facility].  She had an episode of atrial fibrillation and underwent cardioversion October 01, 2020 and has been treated with amiodarone.  Amiodarone was discontinued by pulmonary ultimately due to history of bronchiectasis.  Echocardiogram January 2022 showed normal LV function.  Monitor January 2023 showed sinus rhythm with brief runs of PAT, PACs and PVCs and 1 triplet.  Patient has had no recurrent atrial fibrillation since her cardioversion in March 2022.  Patient seen in the [facility] emergency room March 22 with complaints of palpitations and noted to have PVCs.  Patient has contacted the office multiple times recently due to perceived side effects from apixaban and Xarelto.  Patient seen at [facility] October 29 with recurrent atrial fibrillation.  Since last seen, patient states she went out of rhythm yesterday morning.  She felt palpitations and some increased dyspnea.  There is no orthopnea, PND, pedal edema, chest pain or syncope.  Current Outpatient Medications  Medication Sig Dispense Refill   acetaminophen (TYLENOL) 500 MG tablet Take 1,000 mg by mouth 2 (two) times daily.     amLODipine (NORVASC) 5 MG tablet Take 5 mg by mouth daily. 1/2 tablet in morning and 1 tablet at night     apixaban (ELIQUIS) 5 MG TABS tablet Take 1 tablet (5 mg total) by mouth 2 (two) times daily. 60 tablet 3   atorvastatin (LIPITOR) 20 MG tablet Take 20 mg by mouth daily.     bisoprolol (ZEBETA) 5 MG tablet Take 5 mg by mouth daily.     calcium carbonate (TUMS - DOSED IN MG ELEMENTAL CALCIUM) 500 MG chewable tablet Chew 2 tablets by mouth as needed for indigestion or heartburn.     fluticasone (FLONASE) 50 MCG/ACT nasal spray      Multiple Vitamin (MULTIVITAMIN PO) Take by mouth daily.     olmesartan (BENICAR) 40 MG tablet Take 20 mg by mouth in the morning and at bedtime.     omeprazole (PRILOSEC) 20 MG capsule TAKE ONE (1) CAPSULE EACH DAY      triamcinolone cream (KENALOG) 0.1 % Apply 1 Application topically as directed.     montelukast (SINGULAIR) 10 MG tablet Take by mouth.     No current facility-administered medications for this visit.     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   Hyperlipidemia    Hypertension    Iron deficiency anemia 02/05/2018   Wears dentures    full upper dentures and partial lower     Past Surgical History:  Procedure Laterality Date   COLONOSCOPY  05/14/2009   Gessner/ adenoma 2007   COLONOSCOPY  05/2014   Dr Alonza Bogus -[facility] - polyps   TOTAL HIP ARTHROPLASTY Right 2017   UPPER GASTROINTESTINAL ENDOSCOPY     vagiinal hysterectomy  1977   WISDOM TOOTH EXTRACTION  Social History   Socioeconomic History   Marital status: Widowed    Spouse name: Not on file   Number of children: 1   Years of education: Not on file   Highest education level: Not on file  Occupational History   Not on file  Tobacco Use   Smoking status: Former    Years: 10.00    Types: Cigarettes    Quit date: 1968    Years since quitting: 55.8   Smokeless tobacco: Never  Vaping Use   Vaping Use: Never used  Substance and Sexual Activity   Alcohol use: Never   Drug use: Never   Sexual activity: Not on file  Other Topics Concern   Not on file  Social History Narrative   Married   Former smoker, no alcohol no drug use   Social Determinants of Health   Financial Resource Strain: Not on file  Food  Insecurity: Not on file  Transportation Needs: Not on file  Physical Activity: Not on file  Stress: Not on file  Social Connections: Not on file  Intimate Partner Violence: Not on file    Family History  Problem Relation Age of Onset   Heart attack Father    Heart disease Sister    Heart disease Sister    Rectal cancer Neg Hx    Stomach cancer Neg Hx     ROS: no fevers or chills, productive cough, hemoptysis, dysphasia, odynophagia, melena, hematochezia, dysuria, hematuria, rash, seizure activity, orthopnea, PND, pedal edema, claudication. Remaining systems are negative.  Physical Exam: Well-developed well-nourished in no acute distress.  Skin is warm and dry.  HEENT is normal.  Neck is supple.  Chest is clear to auscultation with normal expansion.  Cardiovascular exam is irregular Abdominal exam nontender or distended. No masses palpated. Extremities show no edema. neuro grossly intact  ECG-atrial fibrillation at a rate of 100, left ventricular hypertrophy, nonspecific ST changes.  Personally reviewed  A/P  1 paroxysmal atrial fibrillation-patient has developed recurrent atrial fibrillation and is symptomatic with palpitations and increased dyspnea on exertion.  Increase bisoprolol to 10 mg daily.  Continue apixaban and she has missed no doses in the past 48 hours.  Her atrial fibrillation clearly began 24 hours ago.  We will arrange outpatient cardioversion.  She will then follow-up in the atrial fibrillation clinic as she will likely need an antiarrhythmic to maintain sinus rhythm.  She had been on amiodarone in the past but this was discontinued due to potential lung side effects.  Tikosyn may be best option.  2 hypertension-patient's blood pressure is borderline today.  Discontinue amlodipine given we are increasing bisoprolol to 10 mg daily.  Follow blood pressure and adjust regimen as needed.  3 hyperlipidemia-continue statin.  4 history of palpitations-continue  beta-blocker.  Harlyn Italiano, MD    

## 2022-05-10 ENCOUNTER — Encounter (HOSPITAL_COMMUNITY)
Admission: RE | Disposition: A | Discharge status: Home / Self Care | Payer: Self-pay | Source: Home / Self Care | Attending: Cardiology

## 2022-05-10 ENCOUNTER — Ambulatory Visit (HOSPITAL_COMMUNITY): Payer: Medicare Other | Admitting: Anesthesiology

## 2022-05-10 ENCOUNTER — Other Ambulatory Visit: Payer: Self-pay

## 2022-05-10 ENCOUNTER — Ambulatory Visit (HOSPITAL_BASED_OUTPATIENT_CLINIC_OR_DEPARTMENT_OTHER): Payer: Medicare Other | Admitting: Anesthesiology

## 2022-05-10 ENCOUNTER — Ambulatory Visit (HOSPITAL_COMMUNITY)
Admission: RE | Admit: 2022-05-10 | Discharge: 2022-05-10 | Disposition: A | Discharge status: Home / Self Care | Payer: Medicare Other | Attending: Cardiology | Admitting: Cardiology

## 2022-05-10 DIAGNOSIS — R0600 Dyspnea, unspecified: Secondary | ICD-10-CM | POA: Diagnosis not present

## 2022-05-10 DIAGNOSIS — K219 Gastro-esophageal reflux disease without esophagitis: Secondary | ICD-10-CM | POA: Diagnosis not present

## 2022-05-10 DIAGNOSIS — M1990 Unspecified osteoarthritis, unspecified site: Secondary | ICD-10-CM | POA: Diagnosis not present

## 2022-05-10 DIAGNOSIS — I4891 Unspecified atrial fibrillation: Secondary | ICD-10-CM | POA: Diagnosis not present

## 2022-05-10 DIAGNOSIS — I480 Paroxysmal atrial fibrillation: Secondary | ICD-10-CM | POA: Diagnosis present

## 2022-05-10 DIAGNOSIS — I10 Essential (primary) hypertension: Secondary | ICD-10-CM | POA: Diagnosis not present

## 2022-05-10 DIAGNOSIS — I493 Ventricular premature depolarization: Secondary | ICD-10-CM | POA: Diagnosis not present

## 2022-05-10 DIAGNOSIS — Z7901 Long term (current) use of anticoagulants: Secondary | ICD-10-CM | POA: Diagnosis not present

## 2022-05-10 DIAGNOSIS — E119 Type 2 diabetes mellitus without complications: Secondary | ICD-10-CM

## 2022-05-10 DIAGNOSIS — R002 Palpitations: Secondary | ICD-10-CM | POA: Insufficient documentation

## 2022-05-10 DIAGNOSIS — Z87891 Personal history of nicotine dependence: Secondary | ICD-10-CM | POA: Insufficient documentation

## 2022-05-10 HISTORY — PX: CARDIOVERSION: SHX1299

## 2022-05-10 SURGERY — CARDIOVERSION
Anesthesia: General

## 2022-05-10 MED ORDER — SODIUM CHLORIDE 0.9 % IV SOLN: INTRAVENOUS | Status: DC

## 2022-05-10 MED ORDER — PROPOFOL 10 MG/ML IV BOLUS
INTRAVENOUS | Status: DC | PRN
  Administered 2022-05-10: 60 mg via INTRAVENOUS

## 2022-05-10 MED ORDER — LIDOCAINE HCL (CARDIAC) PF 100 MG/5ML IV SOSY
PREFILLED_SYRINGE | INTRAVENOUS | Status: DC | PRN
  Administered 2022-05-10: 20 mg via INTRATRACHEAL

## 2022-05-10 MED ORDER — BISOPROLOL FUMARATE 10 MG PO TABS: 10.0000 mg | ORAL_TABLET | Freq: Every day | ORAL | Status: DC

## 2022-05-10 NOTE — CV Procedure (Signed)
    Electrical Cardioversion Procedure Note [name] [name] [id number] Mar 11, 1941  Procedure: Electrical Cardioversion Indications:  Atrial Fibrillation  Time Out: Verified patient identification, verified procedure,medications/allergies/relevent history reviewed, required imaging and test results available.  Performed  Procedure Details  The patient was NPO after midnight. Anesthesia was administered at the beside  by Dr.Fitzgerald with 60mg  of propofol and 20mg  Lidocaine.  Cardioversion was done with synchronized biphasic defibrillation with AP pads with 150watts.  The patient converted to normal sinus rhythm. The patient tolerated the procedure well   IMPRESSION:  Successful cardioversion of atrial fibrillation    [name] [name] 05/10/2022, 9:42 AM

## 2022-05-10 NOTE — Anesthesia Postprocedure Evaluation (Signed)
Anesthesia Post Note  Patient: [name] [name]  Procedure(s) Performed: CARDIOVERSION     Patient location during evaluation: PACU Anesthesia Type: General Level of consciousness: awake and alert Pain management: pain level controlled Vital Signs Assessment: post-procedure vital signs reviewed and stable Respiratory status: spontaneous breathing, nonlabored ventilation, respiratory function stable and patient connected to nasal cannula oxygen Cardiovascular status: blood pressure returned to baseline and stable Postop Assessment: no apparent nausea or vomiting Anesthetic complications: no   No notable events documented.  Last Vitals:  Vitals:   05/10/22 1147 05/10/22 1220  BP: (!) 141/78 (!) 111/53  Pulse: (!) 110 62  Resp: 18 16  Temp: (!) 36 C (!) 36.3 C  SpO2: 98% 98%    Last Pain:  Vitals:   05/10/22 1220  TempSrc: Temporal  PainSc: 0-No pain                 Tiajuana Amass

## 2022-05-10 NOTE — Interval H&P Note (Signed)
History and Physical Interval Note:  05/10/2022 11:42 AM  [name] [name]  has presented today for surgery, with the diagnosis of afib.  The various methods of treatment have been discussed with the patient and family. After consideration of risks, benefits and other options for treatment, the patient has consented to  Procedure(s): CARDIOVERSION (N/A) as a surgical intervention.  The patient's history has been reviewed, patient examined, no change in status, stable for surgery.  I have reviewed the patient's chart and labs.  Questions were answered to the patient's satisfaction.     Fransico Him

## 2022-05-10 NOTE — Transfer of Care (Signed)
Immediate Anesthesia Transfer of Care Note  Patient: [name] [name]  Procedure(s) Performed: CARDIOVERSION  Patient Location: Endoscopy Unit  Anesthesia Type:General  Level of Consciousness: awake, alert  and oriented  Airway & Oxygen Therapy: Patient Spontanous Breathing  Post-op Assessment: Report given to RN and Post -op Vital signs reviewed and stable  Post vital signs: Reviewed and stable  Last Vitals:  Vitals Value Taken Time  BP    Temp    Pulse    Resp    SpO2      Last Pain:  Vitals:   05/10/22 1147  TempSrc: Temporal  PainSc: 0-No pain         Complications: No notable events documented.

## 2022-05-10 NOTE — Anesthesia Preprocedure Evaluation (Signed)
Anesthesia Evaluation  Patient identified by MRN, date of birth, ID band Patient awake    Reviewed: Allergy & Precautions, NPO status , Patient's Chart, lab work & pertinent test results  Airway Mallampati: II  TM Distance: >3 FB Neck ROM: Full    Dental  (+) Dental Advisory Given   Pulmonary former smoker,    breath sounds clear to auscultation       Cardiovascular hypertension, Pt. on medications and Pt. on home beta blockers + dysrhythmias Atrial Fibrillation  Rhythm:Irregular Rate:Normal     Neuro/Psych negative neurological ROS     GI/Hepatic Neg liver ROS, GERD  ,  Endo/Other  diabetes, Type 2  Renal/GU negative Renal ROS     Musculoskeletal  (+) Arthritis ,   Abdominal   Peds  Hematology negative hematology ROS (+)   Anesthesia Other Findings   Reproductive/Obstetrics                             Anesthesia Physical Anesthesia Plan  ASA: 3  Anesthesia Plan: General   Post-op Pain Management: Minimal or no pain anticipated   Induction: Intravenous  PONV Risk Score and Plan: 3 and Treatment may vary due to age or medical condition  Airway Management Planned: Natural Airway and Mask  Additional Equipment:   Intra-op Plan:   Post-operative Plan:   Informed Consent: I have reviewed the patients History and Physical, chart, labs and discussed the procedure including the risks, benefits and alternatives for the proposed anesthesia with the patient or authorized representative who has indicated his/her understanding and acceptance.       Plan Discussed with: CRNA  Anesthesia Plan Comments:         Anesthesia Quick Evaluation

## 2022-05-11 ENCOUNTER — Ambulatory Visit: Payer: Medicare Other | Admitting: Cardiology

## 2022-05-11 ENCOUNTER — Encounter: Payer: Self-pay | Admitting: Cardiology

## 2022-05-11 ENCOUNTER — Encounter (HOSPITAL_COMMUNITY): Payer: Self-pay | Admitting: Cardiology

## 2022-05-11 DIAGNOSIS — I480 Paroxysmal atrial fibrillation: Secondary | ICD-10-CM

## 2022-05-16 ENCOUNTER — Other Ambulatory Visit (HOSPITAL_BASED_OUTPATIENT_CLINIC_OR_DEPARTMENT_OTHER): Payer: Self-pay

## 2022-05-17 ENCOUNTER — Ambulatory Visit (HOSPITAL_COMMUNITY)
Admission: RE | Admit: 2022-05-17 | Discharge: 2022-05-17 | Disposition: A | Discharge status: Home / Self Care | Payer: Medicare Other | Source: Ambulatory Visit | Attending: Physician Assistant | Admitting: Physician Assistant

## 2022-05-17 ENCOUNTER — Ambulatory Visit (HOSPITAL_COMMUNITY): Payer: Medicare Other | Admitting: Physician Assistant

## 2022-05-17 ENCOUNTER — Encounter (HOSPITAL_COMMUNITY): Payer: Self-pay | Admitting: Physician Assistant

## 2022-05-17 VITALS — BP 184/96 | HR 56 | Wt 192.8 lb

## 2022-05-17 DIAGNOSIS — E119 Type 2 diabetes mellitus without complications: Secondary | ICD-10-CM | POA: Diagnosis not present

## 2022-05-17 DIAGNOSIS — Z6832 Body mass index (BMI) 32.0-32.9, adult: Secondary | ICD-10-CM | POA: Insufficient documentation

## 2022-05-17 DIAGNOSIS — Z7901 Long term (current) use of anticoagulants: Secondary | ICD-10-CM | POA: Diagnosis not present

## 2022-05-17 DIAGNOSIS — I10 Essential (primary) hypertension: Secondary | ICD-10-CM | POA: Insufficient documentation

## 2022-05-17 DIAGNOSIS — E785 Hyperlipidemia, unspecified: Secondary | ICD-10-CM | POA: Diagnosis not present

## 2022-05-17 DIAGNOSIS — Z79899 Other long term (current) drug therapy: Secondary | ICD-10-CM | POA: Diagnosis not present

## 2022-05-17 DIAGNOSIS — D6869 Other thrombophilia: Secondary | ICD-10-CM | POA: Insufficient documentation

## 2022-05-17 DIAGNOSIS — I4819 Other persistent atrial fibrillation: Secondary | ICD-10-CM | POA: Diagnosis present

## 2022-05-17 DIAGNOSIS — E669 Obesity, unspecified: Secondary | ICD-10-CM | POA: Diagnosis not present

## 2022-05-17 LAB — CBC
HCT: 33.8 % — ABNORMAL LOW (ref 35.0–45.0)
Hemoglobin: 11.4 g/dL — ABNORMAL LOW (ref 11.7–15.5)
MCH: 29.4 pg (ref 27.0–33.0)
MCHC: 33.7 g/dL (ref 32.0–36.0)
MCV: 87.1 fL (ref 80.0–100.0)
MPV: 10.9 fL (ref 7.5–12.5)
Platelets: 264 10*3/uL (ref 140–400)
RBC: 3.88 10*6/uL (ref 3.80–5.10)
RDW: 12.2 % (ref 11.0–15.0)
WBC: 5.8 10*3/uL (ref 3.8–10.8)

## 2022-05-17 NOTE — Progress Notes (Signed)
Primary Care Physician: Nicola Girt, DO Primary Cardiologist: Dr Stanford Breed Primary Electrophysiologist: none Referring Physician: Dr Hiram Gash is a 81 y.o. female with a history of HTN, HLD, DM, atrial fibrillation who presents for consultation in the [facility]. She had an episode of atrial fibrillation and underwent cardioversion October 01, 2020 and has been treated with amiodarone.  Amiodarone was discontinued by pulmonary ultimately due to history of bronchiectasis. Monitor January 2023 showed sinus rhythm with brief runs of PAT, PACs and PVCs and 1 triplet. Patient seen in the [facility] emergency room March 22 with complaints of palpitations and noted to have PVCs.  Patient is on Eliquis for a CHADS2VASC score of 7. She was seen by Dr Stanford Breed 05/09/22 and was found to be back in afib. She had repeat DCCV on 05/10/22.   Today, patient reports that she feels well from an afib standpoint. She remains in SR. She does have skin irritation on the back of her hands, elbows and knees which is red, dry, and scaling. She reports that it started a few weeks ago. She attributed this to her anticoagulation.  Today, she denies symptoms of palpitations, chest pain, shortness of breath, orthopnea, PND, lower extremity edema, dizziness, presyncope, syncope, snoring, daytime somnolence, bleeding, or neurologic sequela. The patient is tolerating medications without difficulties and is otherwise without complaint today.    Atrial Fibrillation Risk Factors:  she does not have symptoms or diagnosis of sleep apnea. she does not have a history of rheumatic fever.   she has a BMI of Body mass index is 32.08 kg/m.Marland Kitchen Filed Weights   05/17/22 1542  Weight: 87.5 kg    Family History  Problem Relation Age of Onset   Heart attack Father    Heart disease Sister    Heart disease Sister    Rectal cancer Neg Hx    Stomach cancer Neg Hx      Atrial Fibrillation  Management history:  Previous antiarrhythmic drugs: amiodarone  Previous cardioversions: 10/01/20, 05/10/22 Previous ablations: none CHADS2VASC score: 7 Anticoagulation history: Eliquis   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   # 1 seen 01/2018   Arthritis    shoulders, knees   Bronchiectasis ([place])    Cataract    just watching right eye   COLONIC POLYPS, ADENOMATOUS, HX OF 03/27/2009   Annotation: 8 mm adenoma Qualifier: Diagnosis of  By: Carlean Purl MD, Tonna Boehringer E    Depression    Diabetes mellitus without complication ([place])    prediabetic - no meds currently   GERD (gastroesophageal reflux disease)    Hearing loss, 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   Hyperlipidemia    Hypertension    Iron deficiency anemia 02/05/2018   Wears dentures    full upper dentures and partial lower    Past Surgical History:  Procedure Laterality Date   CARDIOVERSION N/A 05/10/2022   Procedure: CARDIOVERSION;  Surgeon: Sueanne Margarita, MD;  Location: [facility] ENDOSCOPY;  Service: Cardiovascular;  Laterality: N/A;   COLONOSCOPY  05/14/2009   Gessner/ adenoma 2007   COLONOSCOPY  05/2014   Dr Alonza Bogus -[facility] - polyps   TOTAL HIP ARTHROPLASTY Right 2017   UPPER GASTROINTESTINAL ENDOSCOPY     vagiinal hysterectomy  1977   WISDOM TOOTH EXTRACTION      Current Outpatient Medications  Medication Sig Dispense Refill   acetaminophen (  TYLENOL) 500 MG tablet Take 1,000-1,500 mg by mouth See admin instructions. 1500 mg in the morning, 1000 mg at bedtime     apixaban (ELIQUIS) 5 MG TABS tablet Take 1 tablet (5 mg total) by mouth 2 (two) times daily. 60 tablet 3   atorvastatin (LIPITOR) 20 MG tablet Take 20 mg by mouth every evening.     bisoprolol (ZEBETA) 10 MG tablet Take 1 tablet (10 mg total) by mouth daily.     calcium carbonate (TUMS - DOSED IN MG ELEMENTAL CALCIUM) 500 MG chewable tablet Chew 2 tablets by mouth as needed for  indigestion or heartburn.     diphenoxylate-atropine (LOMOTIL) 2.5-0.025 MG tablet Take 1 tablet by mouth 4 (four) times daily as needed for diarrhea or loose stools.     Multiple Vitamin (MULTIVITAMIN PO) Take 1 tablet by mouth daily.     olmesartan (BENICAR) 40 MG tablet Take 20 mg by mouth in the morning and at bedtime.     omeprazole (PRILOSEC) 20 MG capsule Take 20 mg by mouth daily.     triamcinolone cream (KENALOG) 0.1 % Apply 1 Application topically daily as needed (dry skin).     fluticasone (FLONASE) 50 MCG/ACT nasal spray Place 1 spray into both nostrils daily as needed for allergies. (Patient not taking: Reported on 05/17/2022)     montelukast (SINGULAIR) 10 MG tablet Take 10 mg by mouth at bedtime as needed (wheezing). (Patient not taking: Reported on 05/17/2022)     No current facility-administered medications for this encounter.    Allergies  Allergen Reactions   Cold Relief Plus  [Chlorphen-Phenyleph-Asa] Palpitations   Ace Inhibitors     tachycardia   Codeine     Causes low BP and vomiting, Caused skin to peel    Reglan [Metoclopramide]     Causes jitters/anxiety   Epinephrine Palpitations    Social History   Socioeconomic History   Marital status: Widowed    Spouse name: Not on file   Number of children: 1   Years of education: Not on file   Highest education level: Not on file  Occupational History   Not on file  Tobacco Use   Smoking status: Former    Years: 10.00    Types: Cigarettes    Quit date: 29    Years since quitting: 55.8   Smokeless tobacco: Never  Vaping Use   Vaping Use: Never used  Substance and Sexual Activity   Alcohol use: Never   Drug use: Never   Sexual activity: Not on file  Other Topics Concern   Not on file  Social History Narrative   Married   Former smoker, no alcohol no drug use   Social Determinants of Radio broadcast assistant Strain: Not on file  Food Insecurity: Not on file  Transportation Needs: Not on file   Physical Activity: Not on file  Stress: Not on file  Social Connections: Not on file  Intimate Partner Violence: Not on file     ROS- All systems are reviewed and negative except as per the HPI above.  Physical Exam: Vitals:   05/17/22 1542  BP: (!) 184/96  Pulse: (!) 56  SpO2: 98%  Weight: 87.5 kg    GEN- The patient is a well appearing elderly female, alert and oriented x 3 today.   Head- normocephalic, atraumatic Eyes-  Sclera clear, conjunctiva pink Ears- hearing intact Oropharynx- clear Neck- supple  Lungs- Clear to ausculation bilaterally, normal work of breathing Heart- Regular  rate and rhythm, no murmurs, rubs or gallops  GI- soft, NT, ND, + BS Extremities- no clubbing, cyanosis, or edema MS- no significant deformity or atrophy Skin- erythematous, scaling patches on dorsum of hand, elbows, and knees.  Psych- euthymic mood, full affect Neuro- strength and sensation are intact  Wt Readings from Last 3 Encounters:  05/17/22 87.5 kg  05/10/22 83.9 kg  05/09/22 85.5 kg    EKG today demonstrates  SB Vent. rate 57 BPM PR interval 156 ms QRS duration 76 ms QT/QTcB 426/414 ms  Epic records are reviewed at length today  CHA2DS2-VASc Score = 7  The patient's score is based upon: CHF History: 0 HTN History: 1 Diabetes History: 1 Stroke History: 2 (TIA per chart in care everywhere) Vascular Disease History: 0 Age Score: 2 Gender Score: 1       ASSESSMENT AND PLAN: 1. Persistent Atrial Fibrillation (ICD10:  I48.19) The patient's CHA2DS2-VASc score is 7, indicating a 11.2% annual risk of stroke.   Patient in [place] today. We discussed rhythm control options today including dofetilide. Information sheet given. She would like to take time to consider if her symptoms warrant a hospital stay. She is very concerned about her bleeding risk on anticoagulation. We discussed the Watchman device today and she is interested in consultation.  Continue Eliquis 5 mg BID for  now. Continue bisoprolol 10 mg daily  2. Secondary Hypercoagulable State (ICD10:  D68.69) The patient is at significant risk for stroke/thromboembolism based upon her CHA2DS2-VASc Score of 7.  Continue Apixaban (Eliquis).   3. Obesity Body mass index is 32.08 kg/m. Lifestyle modification was discussed at length including regular exercise and weight reduction.  4. HTN Elevated today, patient reports her BP at home is typically [id number] systolic. Was well controlled at her visit with Dr Stanford Breed. Patient admits she was very anxious about her visit today. Will not make any changes at this time.    Will refer to Dr Quentin Ore for discussion of Watchman and rhythm control.    [facility] [address] [place], [place] [address] [phone] 05/17/2022 3:56 PM

## 2022-05-19 ENCOUNTER — Telehealth: Payer: Self-pay

## 2022-05-19 MED ORDER — AMLODIPINE BESYLATE 5 MG PO TABS: 5.0000 mg | ORAL_TABLET | Freq: Every day | ORAL | 3 refills | Status: DC

## 2022-05-19 NOTE — Telephone Encounter (Signed)
LMOM for patient to return call.  We can try transition to dabigitran 150 mg bid.

## 2022-05-19 NOTE — Telephone Encounter (Signed)
Called to arrange ablation/LAAO consult per Jorja Loa.  Left message to call back.

## 2022-05-19 NOTE — Addendum Note (Signed)
Addended by: Rosalee Kaufman on: 05/19/2022 04:30 PM   Modules accepted: Orders

## 2022-05-19 NOTE — Telephone Encounter (Signed)
Spoke with patient.  She was seen by dermatology today.  Was told that her skin issues are not related to the medication, but that she has eczema.  Was given pulse dose of prednisone as well as topical ointment for treatment.    Apparently bleeding skin is from scratching the dry skin, explained that switching anticoagulants would not prevent that from occurring.  She should stay on Eliquis.  Patient agreed with plan.  Also was told by dermatologist that her BP may spike up for a few days with the prednisone.  Has already been high the last few days after stopping amlodipine.  Advised that she resume 5 mg dose once daily and monitor BP.  If BP drops to systolic < 110, she should reach out to the office.   Patient voiced understanding

## 2022-05-20 ENCOUNTER — Encounter: Payer: Self-pay | Admitting: Emergency Medicine

## 2022-05-20 ENCOUNTER — Ambulatory Visit
Admission: EM | Admit: 2022-05-20 | Discharge: 2022-05-20 | Disposition: A | Discharge status: Home / Self Care | Payer: Medicare Other | Attending: Family Medicine | Admitting: Family Medicine

## 2022-05-20 DIAGNOSIS — R197 Diarrhea, unspecified: Secondary | ICD-10-CM | POA: Diagnosis present

## 2022-05-20 DIAGNOSIS — E871 Hypo-osmolality and hyponatremia: Secondary | ICD-10-CM | POA: Insufficient documentation

## 2022-05-20 NOTE — ED Provider Notes (Signed)
[name]    CSN: [id number] Arrival date & time: 05/20/22  1012      History   Chief Complaint Chief Complaint  Patient presents with   Diarrhea    HPI Ewa Hipp is a 81 y.o. female.   Patient reports that she suddenly developed watery diarrhea two weeks ago, although she has not felt ill and denies nausea/vomiting, fever/chills, abdominal pain, etc.  She has been prescribed Lomotil QID which now is not very effective.  She denies recent foreign travel, or drinking untreated water in a wilderness environment.  She denies recent antibiotic use and changes in diet. She reports that she has an appointment with GI in three days. Review of chart records from 05/08/22 reveals hyponatremia (Na 132) on CMP, normal magnesium and TSH.  CBC showed mild anemia (Hgb 11.*)   The history is provided by the patient.  Diarrhea Quality:  Watery Severity:  Moderate Onset quality:  Sudden Duration:  2 weeks Timing:  Intermittent Progression:  Unchanged Relieved by:  Nothing Worsened by:  Nothing Ineffective treatments: Lomotil. Associated symptoms: no abdominal pain, no arthralgias, no chills, no recent cough, no diaphoresis, no fever and no vomiting   Risk factors: no recent antibiotic use, no sick contacts, no suspicious food intake and no travel to endemic areas     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   Hyperlipidemia    Hypertension    Iron deficiency anemia 02/05/2018    Wears dentures    full upper dentures and partial lower     Patient Active Problem List   Diagnosis Date Noted   Secondary hypercoagulable state ([facility]) 05/17/2022   PAF (paroxysmal atrial fibrillation) ([facility])    Iron deficiency anemia 02/05/2018   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/05/2018   HYPERLIPIDEMIA 08/31/2007   HYPERTENSION 08/31/2007   GERD 08/31/2007   HIATAL HERNIA 08/31/2007   CONSTIPATION 08/31/2007   PALPITATIONS 08/31/2007    Past Surgical History:  Procedure Laterality Date   CARDIOVERSION N/A 05/10/2022   Procedure: CARDIOVERSION;  Surgeon: Quintella Reichert, MD;  Location: [facility] ENDOSCOPY;  Service: Cardiovascular;  Laterality: N/A;   COLONOSCOPY  05/14/2009   Gessner/ adenoma 2007   COLONOSCOPY  05/2014   Dr Marcelene Butte -[facility] - polyps   TOTAL HIP ARTHROPLASTY Right 2017   UPPER GASTROINTESTINAL ENDOSCOPY     vagiinal hysterectomy  1977   WISDOM TOOTH EXTRACTION      OB History   No obstetric history on file.      Home Medications    Prior to Admission medications   Medication Sig Start Date End Date Taking? Authorizing Provider  acetaminophen (TYLENOL) 500 MG tablet Take 1,000-1,500 mg by mouth See admin instructions. 1500 mg in the morning, 1000 mg at bedtime   Yes [provider]  amLODipine (NORVASC) 5 MG tablet Take 1 tablet (5 mg total) by mouth daily.  05/19/22  Yes Crenshaw, Madolyn Frieze, MD  apixaban (ELIQUIS) 5 MG TABS tablet Take 1 tablet (5 mg total) by mouth 2 (two) times daily. 05/05/22  Yes Lewayne Bunting, MD  atorvastatin (LIPITOR) 20 MG tablet Take 20 mg by mouth every evening. 08/06/21  Yes [provider]  bisoprolol (ZEBETA) 10 MG tablet Take 1 tablet (10 mg total) by mouth daily. 05/10/22  Yes Turner, Cornelious Bryant, MD  calcium carbonate (TUMS - DOSED IN MG ELEMENTAL CALCIUM) 500 MG chewable tablet Chew 2 tablets by mouth as needed for indigestion or heartburn.   Yes [provider]  diphenoxylate-atropine (LOMOTIL)  2.5-0.025 MG tablet Take 1 tablet by mouth 4 (four) times daily as needed for diarrhea or loose stools.   Yes [provider]  fluticasone (FLONASE) 50 MCG/ACT nasal spray Place 1 spray into both nostrils daily as needed for allergies. 01/12/18  Yes [provider]  montelukast (SINGULAIR) 10 MG tablet Take 10 mg by mouth at bedtime as needed (wheezing).   Yes [provider]  Multiple Vitamin (MULTIVITAMIN PO) Take 1 tablet by mouth daily.   Yes [provider]  olmesartan (BENICAR) 40 MG tablet Take 20 mg by mouth in the morning and at bedtime.   Yes [provider]  omeprazole (PRILOSEC) 20 MG capsule Take 20 mg by mouth daily. 08/14/14  Yes [provider]  triamcinolone cream (KENALOG) 0.1 % Apply 1 Application topically daily as needed (dry skin). 06/15/20  Yes [provider]    Family History Family History  Problem Relation Age of Onset   Heart attack Father    Heart disease Sister    Heart disease Sister    Rectal cancer Neg Hx    Stomach cancer Neg Hx     Social History Social History   Tobacco Use   Smoking status: Former    Years: 10.00    Types: Cigarettes    Quit date: 1968    Years since quitting: 55.8   Smokeless tobacco: Never  Vaping Use   Vaping Use: Never used  Substance Use Topics   Alcohol use: Never   Drug use: Never     Allergies   Cold relief plus  [chlorphen-phenyleph-asa], Ace inhibitors, Codeine, Reglan [metoclopramide], and Epinephrine   Review of Systems Review of Systems  Constitutional:  Positive for fatigue. Negative for activity change, appetite change, chills, diaphoresis and fever.  Cardiovascular:  Negative for leg swelling.  Gastrointestinal:  Positive for diarrhea. Negative for abdominal pain, blood in stool, nausea and vomiting.  Musculoskeletal:  Negative for arthralgias.  All other systems reviewed and are negative.    Physical Exam Triage Vital Signs ED Triage  Vitals  Enc Vitals Group     BP 05/20/22 1112 (!) 154/59     Pulse Rate 05/20/22 1112 61     Resp 05/20/22 1112 18     Temp 05/20/22 1112 97.8 F (36.6 C)     Temp Source 05/20/22 1112 Oral     SpO2 05/20/22 1112 100 %     Weight --      Height --      Head Circumference --      Peak Flow --      Pain Score 05/20/22 1114 0     Pain Loc --      Pain Edu? --      Excl. in GC? --    No data found.  Updated Vital Signs BP (!) 154/59 (BP Location:  Left Arm)   Pulse 61   Temp 97.8 F (36.6 C) (Oral)   Resp 18   SpO2 100%   Visual Acuity Right Eye Distance:   Left Eye Distance:   Bilateral Distance:    Right Eye Near:   Left Eye Near:    Bilateral Near:     Physical Exam Vitals and nursing note reviewed.  Constitutional:      General: She is not in acute distress. HENT:     Head: Normocephalic.     Nose: Nose normal.     Mouth/Throat:     Mouth: Mucous membranes are moist.     Pharynx: Oropharynx is clear.  Eyes:     Pupils: Pupils are equal, round, and reactive to light.  Cardiovascular:     Rate and Rhythm: Normal rate and regular rhythm.     Heart sounds: Normal heart sounds.  Pulmonary:     Breath sounds: Normal breath sounds.  Abdominal:     General: Bowel sounds are increased.     Palpations: Abdomen is soft. There is no splenomegaly.     Tenderness: There is no abdominal tenderness. There is no guarding or rebound.  Musculoskeletal:     Cervical back: Neck supple.     Right lower leg: No edema.     Left lower leg: No edema.  Lymphadenopathy:     Cervical: No cervical adenopathy.  Skin:    General: Skin is warm and dry.     Findings: No rash.  Neurological:     Mental Status: She is alert and oriented to person, place, and time.      UC Treatments / Results  Labs (all labs ordered are listed, but only abnormal results are displayed) Labs Reviewed  GASTROINTESTINAL PANEL BY PCR, STOOL (REPLACES STOOL CULTURE)  MAGNESIUM  COMPLETE METABOLIC  PANEL WITH GFR    EKG   Radiology No results found.  Procedures Procedures (including critical care time)  Medications Ordered in UC Medications - No data to display  Initial Impression / Assessment and Plan / UC Course  I have reviewed the triage vital signs and the nursing notes.  Pertinent labs & imaging results that were available during my care of the patient were reviewed by me and considered in my medical decision making (see chart for details).    Diarrhea unknown etiology. Labs as shown above. Followup with GI as scheduled in 3 days.  Final Clinical Impressions(s) / UC Diagnoses   Final diagnoses:  Diarrhea, unspecified type     Discharge Instructions      Begin Pedialyte for about 12 to 18 hours until diarrhea stops, then switch to clear liquids (apple juice, clear grape juice, Jello, etc) for about 12 to 18 hours.  When improved, advance to a [employer] (Bananas, Rice, Applesauce, Toast). Then gradually resume a regular diet when tolerated.  Avoid milk products until well.  To decrease diarrhea, mix one teaspoon Citrucel (methylcellulose) in 2 oz water and drink one to three times daily.  Do not drink extra fluids with this dose and do not drink fluids for one hour afterwards.  When stools become more formed, may take Imodium (loperamide) once or twice daily to decrease stool frequency.   If symptoms become significantly worse during the night or over the weekend, proceed to the local emergency room.     ED Prescriptions   None       Lattie Haw, MD 05/22/22 1145

## 2022-05-20 NOTE — ED Triage Notes (Signed)
Patient c/o watery diarrhea x 2 weeks.  Patient is currently Lomotil 4x daily w/o relief.  No nausea or vomiting.  No new foods.  Patient has increased her water intake.  Patient does have an appt on Monday with GI.

## 2022-05-20 NOTE — Discharge Instructions (Addendum)
Begin Pedialyte for about 12 to 18 hours until diarrhea stops, then switch to clear liquids (apple juice, clear grape juice, Jello, etc) for about 12 to 18 hours.  When improved, advance to a [employer] (Bananas, Rice, Applesauce, Toast). Then gradually resume a regular diet when tolerated.  Avoid milk products until well.  To decrease diarrhea, mix one teaspoon Citrucel (methylcellulose) in 2 oz water and drink one to three times daily.  Do not drink extra fluids with this dose and do not drink fluids for one hour afterwards.  When stools become more formed, may take Imodium (loperamide) once or twice daily to decrease stool frequency.   If symptoms become significantly worse during the night or over the weekend, proceed to the local emergency room.

## 2022-05-21 LAB — COMPLETE METABOLIC PANEL WITH GFR
AG Ratio: 2 (calc) (ref 1.0–2.5)
ALT: 9 U/L (ref 6–29)
AST: 16 U/L (ref 10–35)
Albumin: 4 g/dL (ref 3.6–5.1)
Alkaline phosphatase (APISO): 103 U/L (ref 37–153)
BUN: 12 mg/dL (ref 7–25)
CO2: 27 mmol/L (ref 20–32)
Calcium: 8.5 mg/dL — ABNORMAL LOW (ref 8.6–10.4)
Chloride: 99 mmol/L (ref 98–110)
Creat: 0.85 mg/dL (ref 0.60–0.95)
Globulin: 2 g/dL (calc) (ref 1.9–3.7)
Glucose, Bld: 91 mg/dL (ref 65–99)
Potassium: 4.6 mmol/L (ref 3.5–5.3)
Sodium: 136 mmol/L (ref 135–146)
Total Bilirubin: 0.4 mg/dL (ref 0.2–1.2)
Total Protein: 6 g/dL — ABNORMAL LOW (ref 6.1–8.1)
eGFR: 68 mL/min/{1.73_m2} (ref >=60)

## 2022-05-21 LAB — GASTROINTESTINAL PANEL BY PCR, STOOL (REPLACES STOOL CULTURE)

## 2022-05-21 LAB — MAGNESIUM: Magnesium: 1.8 mg/dL (ref 1.5–2.5)

## 2022-05-23 NOTE — Telephone Encounter (Signed)
Scheduled the patient 11/29 for EP evaluation with Dr. Lalla Brothers. She was grateful for call and agreed with plan.

## 2022-05-29 ENCOUNTER — Encounter: Payer: Self-pay | Admitting: Cardiology

## 2022-06-03 ENCOUNTER — Encounter: Payer: Self-pay | Admitting: Cardiology

## 2022-06-03 DIAGNOSIS — I480 Paroxysmal atrial fibrillation: Secondary | ICD-10-CM

## 2022-06-06 MED ORDER — BISOPROLOL FUMARATE 5 MG PO TABS: 5.0000 mg | ORAL_TABLET | Freq: Two times a day (BID) | ORAL | 3 refills | Status: DC

## 2022-06-07 NOTE — Progress Notes (Unsigned)
Electrophysiology Office Note:    Date:  06/08/2022   ID:  [name] [name], DOB Jul 15, 1940, MRN [id number]  PCP:  Leola Brazil, DO  [facility] Cardiologist:  None  [facility] Electrophysiologist:  Lanier Prude, MD   Referring MD: Leola Brazil, DO   Chief Complaint: AF  History of Present Illness:    [name] [name] is a 81 y.o. female who presents for an evaluation of AF at the request of Jorja Loa, PA-C. Their medical history includes HTN, HLD, DM, AF. She last saw [facility] 05/17/2022. She was previously on amiodarone but this was discontinued due to bronchiectasis. First diagnosis of AF was in 2022. At recent appt in October with Dr Jens Som she was back in AF after being in sinus for many months. She had repeat DCCV 05/10/2022.  At the last appointment with [facility], rhythm control options were discussed including dofetilide and ablation. They also discussed bleeding risk associated with chronic use of OAC. She is referred to discuss rhythm control and LAAO.  Today she reports easy bruising while on anticoagulation. She asks about using antiplatelets vs anticoagulants for stroke risk mitigation. She uses night time oxygen.      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   Hyperlipidemia    Hypertension    Iron deficiency anemia 02/05/2018   Wears dentures    full upper dentures and partial lower     Past Surgical History:  Procedure Laterality Date   CARDIOVERSION N/A  05/10/2022   Procedure: CARDIOVERSION;  Surgeon: Quintella Reichert, MD;  Location: [facility] ENDOSCOPY;  Service: Cardiovascular;  Laterality: N/A;   COLONOSCOPY  05/14/2009   Gessner/ adenoma 2007   COLONOSCOPY  05/2014   Dr Marcelene Butte -[facility] - polyps   TOTAL HIP ARTHROPLASTY Right 2017   UPPER GASTROINTESTINAL ENDOSCOPY     vagiinal hysterectomy  1977   WISDOM TOOTH EXTRACTION      Current Medications: Current Meds  Medication Sig   acetaminophen (TYLENOL) 500 MG tablet Take 1,000-1,500 mg by mouth See admin instructions. 1500 mg in the morning, 1000 mg at bedtime   amLODipine (NORVASC) 5 MG tablet Take 1 tablet (5 mg total) by mouth daily. (Patient taking differently: Take 5 mg by mouth daily. TAKES 1/2 TAB AM AND 1 TAB PM)   apixaban (ELIQUIS) 5 MG TABS tablet Take 1 tablet (5 mg total) by mouth 2 (two) times daily.   atorvastatin (LIPITOR) 20 MG tablet Take 20 mg by mouth every evening.   bisoprolol (ZEBETA) 5 MG tablet Take 1 tablet (5 mg total) by mouth 2 (two) times daily.   calcium carbonate (TUMS - DOSED IN MG ELEMENTAL CALCIUM) 500 MG chewable tablet Chew 2 tablets by mouth as needed for indigestion or heartburn.   desoximetasone (TOPICORT) 0.25 % cream SMARTSIG:Sparingly Topical Twice Daily PRN   diphenoxylate-atropine (LOMOTIL) 2.5-0.025 MG tablet Take 1 tablet  by mouth 4 (four) times daily as needed for diarrhea or loose stools.   fluticasone (FLONASE) 50 MCG/ACT nasal spray Place 1 spray into both nostrils daily as needed for allergies.   montelukast (SINGULAIR) 10 MG tablet Take 10 mg by mouth at bedtime as needed (wheezing).   Multiple Vitamin (MULTIVITAMIN PO) Take 1 tablet by mouth daily.   olmesartan (BENICAR) 40 MG tablet Take 20 mg by mouth in the morning and at bedtime.   omeprazole (PRILOSEC) 20 MG capsule Take 20 mg by mouth daily.   triamcinolone cream (KENALOG) 0.1 % Apply 1 Application topically daily as needed (dry skin).     Allergies:   Cold relief plus   [chlorphen-phenyleph-asa], Ace inhibitors, Codeine, Reglan [metoclopramide], and Epinephrine   Social History   Socioeconomic History   Marital status: Widowed    Spouse name: Not on file   Number of children: 1   Years of education: Not on file   Highest education level: Not on file  Occupational History   Not on file  Tobacco Use   Smoking status: Former    Years: 10.00    Types: Cigarettes    Quit date: 64    Years since quitting: 55.9   Smokeless tobacco: Never  Vaping Use   Vaping Use: Never used  Substance and Sexual Activity   Alcohol use: Never   Drug use: Never   Sexual activity: Not Currently    Birth control/protection: Post-menopausal  Other Topics Concern   Not on file  Social History Narrative   Married   Former smoker, no alcohol no drug use   Social Determinants of Corporate investment banker Strain: Not on file  Food Insecurity: Not on file  Transportation Needs: Not on file  Physical Activity: Not on file  Stress: Not on file  Social Connections: Not on file     Family History: The patient's family history includes Heart attack in her father; Heart disease in her sister and sister. There is no history of Rectal cancer or Stomach cancer.  ROS:   Please see the history of present illness.    All other systems reviewed and are negative.  EKGs/Labs/Other Studies Reviewed:    The following studies were reviewed today:  05/17/2022 ECG shows sinus bradycardia and QTc    Recent Labs: 05/16/2022: Hemoglobin 11.4; Platelets 264 05/20/2022: ALT 9; BUN 12; Creat 0.85; Magnesium 1.8; Potassium 4.6; Sodium 136  Recent Lipid Panel No results found for: "CHOL", "TRIG", "HDL", "CHOLHDL", "VLDL", "LDLCALC", "LDLDIRECT"  Physical Exam:    VS:  BP (!) 142/68   Pulse (!) 59   Ht 5\' 5"  (1.651 m)   Wt 187 lb (84.8 kg)   SpO2 97%   BMI 31.12 kg/m     Wt Readings from Last 3 Encounters:  06/08/22 187 lb (84.8 kg)  05/17/22 192 lb 12.8 oz (87.5  kg)  05/10/22 185 lb (83.9 kg)     GEN:  Well nourished, well developed in no acute distress. Obese. HEENT: Normal NECK: No JVD; No carotid bruits LYMPHATICS: No lymphadenopathy CARDIAC: RRR, no murmurs, rubs, gallops RESPIRATORY:  Clear to auscultation without rales, wheezing or rhonchi  ABDOMEN: Soft, non-tender, non-distended MUSCULOSKELETAL:  No edema; No deformity  SKIN: Warm and dry. Scattered bruises. NEUROLOGIC:  Alert and oriented x 3 PSYCHIATRIC:  Normal affect       ASSESSMENT:    1. Persistent atrial fibrillation (HCC)   2. Primary hypertension    PLAN:  In order of problems listed above:   #Persistent AF S/p DCCV 05/10/2022. Maintaining sinus rhythm. Discussed rhythm control options in detail during today's clinic appointment. She is not a good ablation candidate given her oxygen use. I went over dofetilide in detail including the need for inpatient hospitalization during its initiation. If her AF becomes more problematic/recurs, plan to pursue dofetilide initiation.  Given her supplemental oxygen use, I think she is at prohibitive risk for Watchman implant. I would recommend continuing her anticoagulant.    HAS-BLED score 3 Hypertension Yes  Abnormal renal and liver function (Dialysis, transplant, Cr >2.26 mg/dL /Cirrhosis or Bilirubin >2x Normal or AST/ALT/AP >3x Normal) No  Stroke Yes  Bleeding No  Labile INR (Unstable/high INR) No  Elderly (>65) Yes  Drugs or alcohol (? 8 drinks/week, anti-plt or NSAID) No   CHA2DS2-VASc Score = 7  The patient's score is based upon: CHF History: 0 HTN History: 1 Diabetes History: 1 Stroke History: 2 (TIA per chart in care everywhere) Vascular Disease History: 0 Age Score: 2 Gender Score: 1   #Hypertension Slightly above goal today.  Recommend checking blood pressures 1-2 times per week at home and recording the values.  Recommend bringing these recordings to the primary care physician.  Follow up 6  months with AF Clinic.   Medication Adjustments/Labs and Tests Ordered: Current medicines are reviewed at length with the patient today.  Concerns regarding medicines are outlined above.  No orders of the defined types were placed in this encounter.  No orders of the defined types were placed in this encounter.    Signed, Hilton Cork. Quentin Ore, MD, [facility], [facility] 06/08/2022 11:31 PM    Electrophysiology [facility]

## 2022-06-08 ENCOUNTER — Ambulatory Visit: Payer: Medicare Other | Attending: Cardiology | Admitting: Cardiology

## 2022-06-08 ENCOUNTER — Encounter: Payer: Self-pay | Admitting: Cardiology

## 2022-06-08 VITALS — BP 142/68 | HR 59 | Ht 65.0 in | Wt 187.0 lb

## 2022-06-08 DIAGNOSIS — I10 Essential (primary) hypertension: Secondary | ICD-10-CM | POA: Diagnosis present

## 2022-06-08 DIAGNOSIS — I4819 Other persistent atrial fibrillation: Secondary | ICD-10-CM | POA: Diagnosis present

## 2022-06-08 NOTE — Patient Instructions (Signed)
Medication Instructions:  Your physician recommends that you continue on your current medications as directed. Please refer to the Current Medication list given to you today.  *If you need a refill on your cardiac medications before your next appointment, please call your pharmacy*  Follow-Up: At [facility], you and your health needs are our priority.  As part of our continuing mission to provide you with exceptional heart care, we have created designated Provider Care Teams.  These Care Teams include your primary Cardiologist (physician) and Advanced Practice Providers (APPs -  Physician Assistants and Nurse Practitioners) who all work together to provide you with the care you need, when you need it.  Your next appointment:   6 month(s)  The format for your next appointment:   In Person  Provider:   You will follow up in the Atrial Fibrillation Clinic located at [facility]. Your provider will be: Clint R. Fenton, PA-C    Important Information About Sugar

## 2022-06-09 ENCOUNTER — Encounter: Payer: Self-pay | Admitting: Cardiology

## 2022-06-14 ENCOUNTER — Encounter: Payer: Self-pay | Admitting: Cardiology

## 2022-06-14 MED ORDER — DABIGATRAN ETEXILATE MESYLATE 150 MG PO CAPS: 150.0000 mg | ORAL_CAPSULE | Freq: Two times a day (BID) | ORAL | 11 refills | Status: DC

## 2022-06-14 NOTE — Addendum Note (Signed)
Addended by: Freddi Starr on: 06/14/2022 11:43 AM   Modules accepted: Orders

## 2022-07-12 NOTE — Progress Notes (Signed)
HPI: FU atrial fibrillation.  Patient previously followed at [facility].  She had an episode of atrial fibrillation and underwent cardioversion October 01, 2020 and has been treated with amiodarone.  Amiodarone was discontinued by pulmonary ultimately due to history of bronchiectasis.  Echocardiogram January 2022 showed normal LV function.  Monitor January 2023 showed sinus rhythm with brief runs of PAT, PACs and PVCs and 1 triplet.  Patient has contacted the office multiple times due to perceived side effects from apixaban and Xarelto.  Patient seen at [facility] 10/23 with recurrent atrial fibrillation.  She underwent repeat cardioversion.  Patient was seen by Dr. Quentin Ore for consideration of ablation and felt not to be a good candidate.  He recommended dofetilide therapy if she has recurrences.  He also felt that given supplemental oxygen use she would be at prohibitive risk for watchman implant.  Since last seen, she has some dyspnea on exertion unchanged.  No orthopnea, PND, chest pain or syncope.  No recurrent atrial fibrillation.  Occasional minimal pedal edema.  Current Outpatient Medications  Medication Sig Dispense Refill   acetaminophen (TYLENOL) 500 MG tablet Take 1,000-1,500 mg by mouth See admin instructions. 1500 mg in the morning, 1000 mg at bedtime     amLODipine (NORVASC) 5 MG tablet Take 5 mg by mouth as directed. 1/2 tablet in the morning and 1 tablet in the evening     apixaban (ELIQUIS) 5 MG TABS tablet Take 1 tablet (5 mg total) by mouth 2 (two) times daily. 60 tablet 5   bisoprolol (ZEBETA) 5 MG tablet Take 1 tablet (5 mg total) by mouth 2 (two) times daily. 180 tablet 3   desoximetasone (TOPICORT) 0.25 % cream SMARTSIG:Sparingly Topical Twice Daily PRN     fluticasone (FLONASE) 50 MCG/ACT nasal spray Place 1 spray into both nostrils daily as needed for allergies.     loratadine (CLARITIN) 10 MG tablet Take 10 mg by mouth daily.     montelukast (SINGULAIR) 10 MG tablet  Take 10 mg by mouth at bedtime as needed (wheezing).     olmesartan (BENICAR) 40 MG tablet Take 20 mg by mouth in the morning and at bedtime.     omeprazole (PRILOSEC) 20 MG capsule Take 20 mg by mouth daily.     triamcinolone cream (KENALOG) 0.1 % Apply 1 Application topically daily as needed (dry skin).     No current facility-administered medications for this visit.     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   Hyperlipidemia    Hypertension    Iron deficiency anemia 02/05/2018   Wears dentures    full upper dentures and partial lower     Past Surgical History:  Procedure Laterality Date   CARDIOVERSION N/A 05/10/2022   Procedure: CARDIOVERSION;  Surgeon: Sueanne Margarita, MD;  Location: [facility] ENDOSCOPY;  Service: Cardiovascular;  Laterality: N/A;   COLONOSCOPY  05/14/2009   Gessner/ adenoma 2007   COLONOSCOPY  05/2014   Dr Alonza Bogus -[facility] - polyps   TOTAL HIP ARTHROPLASTY Right 2017   UPPER GASTROINTESTINAL ENDOSCOPY     vagiinal hysterectomy  1977   WISDOM TOOTH EXTRACTION      Social History   Socioeconomic History   Marital status: Widowed    Spouse name: Not on file   Number of children: 1   Years of education: Not on file   Highest education level: Not on file  Occupational History   Not on file  Tobacco Use   Smoking status: Former    Years: 10.00    Types: Cigarettes    Quit date: 47    Years since quitting: 56.0   Smokeless tobacco: Never  Vaping Use    Vaping Use: Never used  Substance and Sexual Activity   Alcohol use: Never   Drug use: Never   Sexual activity: Not Currently    Birth control/protection: Post-menopausal  Other Topics Concern   Not on file  Social History Narrative   Married   Former smoker, no alcohol no drug use   Social Determinants of Radio broadcast assistant Strain: Not on file  Food Insecurity: Not on file  Transportation Needs: Not on file  Physical Activity: Not on file  Stress: Not on file  Social Connections: Not on file  Intimate Partner Violence: Not on file    Family History  Problem Relation Age of Onset   Heart attack Father    Heart disease Sister    Heart disease Sister    Rectal cancer Neg Hx    Stomach cancer Neg Hx     ROS: no fevers or chills, productive cough, hemoptysis, dysphasia, odynophagia, melena, hematochezia, dysuria, hematuria, rash, seizure activity, orthopnea, PND, pedal edema, claudication. Remaining systems are negative.  Physical Exam: Well-developed well-nourished in no acute distress.  Skin is warm and dry.  HEENT is normal.  Neck is supple.  Chest is clear to auscultation with normal expansion.  Cardiovascular exam is regular rate and rhythm.  Abdominal exam nontender or distended. No masses palpated. Extremities show trace edema. neuro grossly intact  ECG-normal sinus rhythm at a rate of 60, occasional PAC, left ventricular hypertrophy.  Personally reviewed  A/P  1 paroxysmal atrial fibrillation-patient has had no recurrent episodes and remains in sinus rhythm.  If she has more frequent episodes in the future we will arrange admission for initiation of Tikosyn.  Continue bisoprolol.  Per Dr. Quentin Ore she is not considered a good candidate for Watchman device or ablation.  Will continue anticoagulation.  2 hypertension-blood pressure controlled.  Continue present medications.  3 hyperlipidemia-continue statin.  4 palpitations-continue  beta-blocker.  Kirk Ruths, MD

## 2022-07-19 ENCOUNTER — Encounter: Payer: Self-pay | Admitting: Cardiology

## 2022-07-20 ENCOUNTER — Other Ambulatory Visit: Payer: Self-pay | Admitting: *Deleted

## 2022-07-20 DIAGNOSIS — I480 Paroxysmal atrial fibrillation: Secondary | ICD-10-CM

## 2022-07-20 MED ORDER — APIXABAN 5 MG PO TABS: 5.0000 mg | ORAL_TABLET | Freq: Two times a day (BID) | ORAL | 5 refills | Status: DC

## 2022-07-20 NOTE — Telephone Encounter (Signed)
Eliquis 5mg  refill request received. Patient is 82 years old, weight-84.8kg, Crea-0.85 on 05/20/22, Diagnosis-Afib, and last seen by Dr Quentin Ore on 06/08/2022 and has an appt with Dr. Stanford Breed on 07/25/22. Dose is appropriate based on dosing criteria. Will send in refill to requested pharmacy.    There was no anticoagulation meds on med list. Per 06/14/22 pt was offered pradaxa or warfarin due to rash and now pt is requesting eliquis refill. Will send eliquis refill.

## 2022-07-25 ENCOUNTER — Encounter: Payer: Self-pay | Admitting: Cardiology

## 2022-07-25 ENCOUNTER — Ambulatory Visit (INDEPENDENT_AMBULATORY_CARE_PROVIDER_SITE_OTHER): Payer: Medicare Other | Admitting: Cardiology

## 2022-07-25 VITALS — BP 132/66 | HR 60 | Ht 65.0 in | Wt 188.1 lb

## 2022-07-25 DIAGNOSIS — I4819 Other persistent atrial fibrillation: Secondary | ICD-10-CM | POA: Diagnosis not present

## 2022-07-25 DIAGNOSIS — I10 Essential (primary) hypertension: Secondary | ICD-10-CM

## 2022-07-25 DIAGNOSIS — E7800 Pure hypercholesterolemia, unspecified: Secondary | ICD-10-CM | POA: Diagnosis not present

## 2022-07-25 NOTE — Patient Instructions (Signed)
    Follow-Up: At [facility], you and your health needs are our priority.  As part of our continuing mission to provide you with exceptional heart care, we have created designated Provider Care Teams.  These Care Teams include your primary Cardiologist (physician) and Advanced Practice Providers (APPs -  Physician Assistants and Nurse Practitioners) who all work together to provide you with the care you need, when you need it.  We recommend signing up for the patient portal called "MyChart".  Sign up information is provided on this After Visit Summary.  MyChart is used to connect with patients for Virtual Visits (Telemedicine).  Patients are able to view lab/test results, encounter notes, upcoming appointments, etc.  Non-urgent messages can be sent to your provider as well.   To learn more about what you can do with MyChart, go to [email or web address].    Your next appointment:   6 month(s)  Provider:   Brian Crenshaw, MD      

## 2022-09-07 ENCOUNTER — Telehealth: Payer: Self-pay | Admitting: Cardiology

## 2022-09-07 NOTE — Telephone Encounter (Signed)
Patient c/o Palpitations:  High priority if patient c/o lightheadedness, shortness of breath, or chest pain  How long have you had palpitations/irregular HR/ Afib? Are you having the symptoms now?   Yes  Are you currently experiencing lightheadedness, SOB or CP?   No  Do you have a history of afib (atrial fibrillation) or irregular heart rhythm?   Yes  Have you checked your BP or HR? (document readings if available):   BP about 100/70's  HR ranging 100-103  Are you experiencing any other symptoms?   Tiredness   Patient stated she she is on oxygen at night.  Patient stated she called EMT's earlier and the recommended she rest.  Patient wants to know if her afib can last for days.

## 2022-09-07 NOTE — Telephone Encounter (Signed)
Pt is reports tiredness and stomach upset "I think the stomach is from something I ate." Pt denies pain. EMTs seen pt this morning. They advised she stay home and rest. Next appt with Dr. Stanford Breed is in March. "It seems to keep coming." "It started this morning. I don't know what made me check my heart rate but I did. Highest was 125 bpm." Pt advised to go to ED for chest pain, and sustained SOB and elevated heart rate. She verbalized understanding, no further questions at this time.

## 2022-09-16 NOTE — Progress Notes (Signed)
HPI: FU atrial fibrillation.  Patient previously followed at [facility].  She had an episode of atrial fibrillation and underwent cardioversion October 01, 2020 and has been treated with amiodarone.  Amiodarone was discontinued by pulmonary ultimately due to history of bronchiectasis.  Echocardiogram January 2022 showed normal LV function.  Monitor January 2023 showed sinus rhythm with brief runs of PAT, PACs and PVCs and 1 triplet.  Patient has contacted the office multiple times due to perceived side effects from apixaban and Xarelto.  Patient seen at [facility] 10/23 with recurrent atrial fibrillation.  She underwent repeat cardioversion.  Patient was seen by Dr. Quentin Ore for consideration of ablation and felt not to be a good candidate.  He recommended dofetilide therapy if she has recurrences.  He also felt that given supplemental oxygen use she would be at prohibitive risk for watchman implant.  Since last seen, she had 1 episode of atrial fibrillation in February documented by ECG by EMS.  Otherwise she has some dyspnea on exertion but no orthopnea, PND, pedal edema, chest pain or syncope.  Current Outpatient Medications  Medication Sig Dispense Refill   acetaminophen (TYLENOL) 500 MG tablet Take 1,000-1,500 mg by mouth See admin instructions. 1500 mg in the morning, 1000 mg at bedtime     amLODipine (NORVASC) 5 MG tablet Take 5 mg by mouth as directed. 1/2 tablet in the morning and 1 tablet in the evening     apixaban (ELIQUIS) 5 MG TABS tablet Take 1 tablet (5 mg total) by mouth 2 (two) times daily. 60 tablet 5   Ascorbic Acid (VITAMIN C) 500 MG CHEW Chew 1 each by mouth daily.     bisoprolol (ZEBETA) 5 MG tablet Take 1 tablet (5 mg total) by mouth 2 (two) times daily. 180 tablet 3   clobetasol cream (TEMOVATE) AB-123456789 % Apply 1 Application topically 2 (two) times daily.     desoximetasone (TOPICORT) 0.25 % cream SMARTSIG:Sparingly Topical Twice Daily PRN     ferrous sulfate 325 (65  FE) MG tablet Take 325 mg by mouth daily.     fluticasone (FLONASE) 50 MCG/ACT nasal spray Place 1 spray into both nostrils daily as needed for allergies.     loratadine (CLARITIN) 10 MG tablet Take 10 mg by mouth daily.     montelukast (SINGULAIR) 10 MG tablet Take 10 mg by mouth at bedtime as needed (wheezing).     olmesartan (BENICAR) 40 MG tablet Take 20 mg by mouth in the morning and at bedtime.     omeprazole (PRILOSEC) 20 MG capsule Take 20 mg by mouth daily.     triamcinolone cream (KENALOG) 0.1 % Apply 1 Application topically daily as needed (dry skin).     No current facility-administered medications for this visit.     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   Hyperlipidemia  Hypertension    Iron deficiency anemia 02/05/2018   Wears dentures    full upper dentures and partial lower     Past Surgical History:  Procedure Laterality Date   CARDIOVERSION N/A 05/10/2022   Procedure: CARDIOVERSION;  Surgeon: Sueanne Margarita, MD;  Location: [facility] ENDOSCOPY;  Service: Cardiovascular;  Laterality: N/A;   COLONOSCOPY  05/14/2009   Gessner/ adenoma 2007   COLONOSCOPY  05/2014   Dr Alonza Bogus -[facility] - polyps   TOTAL HIP ARTHROPLASTY Right 2017   UPPER GASTROINTESTINAL ENDOSCOPY     vagiinal hysterectomy  1977   WISDOM TOOTH EXTRACTION      Social History   Socioeconomic History   Marital status: Widowed    Spouse name: Not on file   Number of children: 1   Years of education:  Not on file   Highest education level: Not on file  Occupational History   Not on file  Tobacco Use   Smoking status: Former    Years: 10    Types: Cigarettes    Quit date: 1968    Years since quitting: 56.2   Smokeless tobacco: Never  Vaping Use   Vaping Use: Never used  Substance and Sexual Activity   Alcohol use: Never   Drug use: Never   Sexual activity: Not Currently    Birth control/protection: Post-menopausal  Other Topics Concern   Not on file  Social History Narrative   Married   Former smoker, no alcohol no drug use   Social Determinants of Radio broadcast assistant Strain: Not on file  Food Insecurity: Not on file  Transportation Needs: Not on file  Physical Activity: Not on file  Stress: Not on file  Social Connections: Not on file  Intimate Partner Violence: Not on file    Family History  Problem Relation Age of Onset   Heart attack Father    Heart disease Sister    Heart disease Sister    Rectal cancer Neg Hx    Stomach cancer Neg Hx     ROS: no fevers or chills, productive cough, hemoptysis, dysphasia, odynophagia, melena, hematochezia, dysuria, hematuria, rash, seizure activity, orthopnea, PND, pedal edema, claudication. Remaining systems are negative.  Physical Exam: Well-developed well-nourished in no acute distress.  Skin is warm and dry.  HEENT is normal.  Neck is supple.  Chest is clear to auscultation with normal expansion.  Cardiovascular exam is regular rate and rhythm.  Abdominal exam nontender or distended. No masses palpated. Extremities show no edema. neuro grossly intact  ECG-sinus bradycardia at a rate of 58, left ventricular hypertrophy.  Personally reviewed  A/P  1 paroxysmal atrial fibrillation-patient had 1 episode of atrial fibrillation that resolved spontaneously in February.  I discussed Tikosyn again today and she would prefer to be conservative at this point.  If she has more frequent episodes in the future then we  will refer for initiation.  Continue bisoprolol and anticoagulation.  Previously evaluated by Dr. Quentin Ore and not felt to be a good candidate for watchman or ablation.  2 palpitations-continue beta-blocker.  3 hypertension-blood pressure controlled.  Continue present medical regimen.  4 hyperlipidemia continue statin.  Kirk Ruths, MD

## 2022-09-26 ENCOUNTER — Encounter: Payer: Self-pay | Admitting: Cardiology

## 2022-09-26 ENCOUNTER — Ambulatory Visit (INDEPENDENT_AMBULATORY_CARE_PROVIDER_SITE_OTHER): Payer: Medicare Other | Admitting: Cardiology

## 2022-09-26 VITALS — BP 142/73 | HR 58 | Ht 65.0 in | Wt 172.0 lb

## 2022-09-26 DIAGNOSIS — E7800 Pure hypercholesterolemia, unspecified: Secondary | ICD-10-CM | POA: Diagnosis not present

## 2022-09-26 DIAGNOSIS — R002 Palpitations: Secondary | ICD-10-CM

## 2022-09-26 DIAGNOSIS — I4819 Other persistent atrial fibrillation: Secondary | ICD-10-CM | POA: Diagnosis not present

## 2022-09-26 DIAGNOSIS — I480 Paroxysmal atrial fibrillation: Secondary | ICD-10-CM

## 2022-09-26 DIAGNOSIS — I10 Essential (primary) hypertension: Secondary | ICD-10-CM

## 2022-09-26 NOTE — Patient Instructions (Signed)
    Follow-Up: At [facility], you and your health needs are our priority.  As part of our continuing mission to provide you with exceptional heart care, we have created designated Provider Care Teams.  These Care Teams include your primary Cardiologist (physician) and Advanced Practice Providers (APPs -  Physician Assistants and Nurse Practitioners) who all work together to provide you with the care you need, when you need it.  We recommend signing up for the patient portal called "MyChart".  Sign up information is provided on this After Visit Summary.  MyChart is used to connect with patients for Virtual Visits (Telemedicine).  Patients are able to view lab/test results, encounter notes, upcoming appointments, etc.  Non-urgent messages can be sent to your provider as well.   To learn more about what you can do with MyChart, go to [email or web address].    Your next appointment:   6 month(s)  Provider:   Brian Crenshaw, MD      

## 2022-09-28 ENCOUNTER — Encounter: Payer: Self-pay | Admitting: Cardiology

## 2022-10-06 ENCOUNTER — Ambulatory Visit (INDEPENDENT_AMBULATORY_CARE_PROVIDER_SITE_OTHER): Payer: Medicare Other

## 2022-10-06 ENCOUNTER — Encounter: Payer: Self-pay | Admitting: Podiatry

## 2022-10-06 ENCOUNTER — Ambulatory Visit (INDEPENDENT_AMBULATORY_CARE_PROVIDER_SITE_OTHER): Payer: Medicare Other | Admitting: Podiatry

## 2022-10-06 DIAGNOSIS — M19071 Primary osteoarthritis, right ankle and foot: Secondary | ICD-10-CM

## 2022-10-06 DIAGNOSIS — M722 Plantar fascial fibromatosis: Secondary | ICD-10-CM | POA: Diagnosis not present

## 2022-10-06 NOTE — Progress Notes (Signed)
  Subjective:  Patient ID: [name] [name], female    DOB: [birth year],   MRN: [id number]  Chief Complaint  Patient presents with   Foot Pain    Patient came in today for right foot arch pain and ankle swelling, which started 6 months ago, patient has seen a orthopedic Dr., patient was put in a  Cam Boot but has since stopped wearing due to knee pain, patient rates the pain a 6 out of 10, X-rays done today     82 y.o. female presents for concern of right midfoot pain. She relates this has been going on for years. She was seen by an orthopedist prior and given a boot but just caused more pain in her knee. Relates she tries to wear good shoes but the pain has been worsening.  . Denies any other pedal complaints. Denies n/v/f/c.   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 CFT <3 seconds. Normal hair growth on digits. No edema.  Skin. No lacerations or abrasions bilateral feet.  Musculoskeletal: MMT 5/5 bilateral lower extremities in DF, PF, Inversion and Eversion. Deceased ROM in DF of ankle joint. Tender to dorsum of midfoot area along TN and TMTJs medial and latearl cuboid area.  Neurological: Sensation intact to light touch. Protective sensation intact.   Assessment:   1. Arthritis of right midfoot       Plan:  Patient was evaluated and treated and all questions answered. Discussed midfoot arthritis with patient and treatment options.  Reviewed notes from orthopedics.  X-rays reviewed. No acute fractures or dislocations. Degenerative changes noted throughout midfoot. Signicant degeneration in the TN and CC joints.  Discussed NSAIDS, topicals, and possible injections.  Discussed trying prenisone taper prescription she relates she has at hom.  Discussed topical voltaren.  Discussed stiff soled shoes and carbon fiber foot plate. Powersteps dispensed today.  Discussed if pain does not improve can discuss surgical options.  Patient to follow-up in 6 weeks to see how she is doing.    Lorenda Peck, DPM

## 2022-10-15 ENCOUNTER — Other Ambulatory Visit: Payer: Self-pay | Admitting: Cardiology

## 2022-10-18 NOTE — Progress Notes (Signed)
HPI: FU atrial fibrillation.  [facility].  She had an episode of atrial fibrillation and underwent cardioversion October 01, 2020 and has been treated with amiodarone.  Amiodarone was discontinued by pulmonary ultimately due to history of bronchiectasis.  Echocardiogram January 2022 showed normal LV function.  Monitor January 2023 showed sinus rhythm with brief runs of PAT, PACs and PVCs and 1 triplet.  Patient has contacted the office multiple times due to perceived side effects from apixaban and Xarelto.  [facility]23 with recurrent atrial fibrillation.  She underwent repeat cardioversion.  Patient was seen by Dr. Lalla BrothersLambert for consideration of ablation and felt not to be a good candidate.  He recommended dofetilide therapy if she has recurrences.  He also felt that given supplemental oxygen use she would be at prohibitive risk for watchman implant.  Seen at [facility] March 2024 with recurrent atrial fibrillation.  She converted to sinus rhythm.  Since last seen, she denies dyspnea, chest pain or syncope.  She did have another episode of atrial fibrillation as outlined above.  Current Outpatient Medications  Medication Sig Dispense Refill   acetaminophen (TYLENOL) 500 MG tablet Take 1,000-1,500 mg by mouth See admin instructions. 1500 mg in the morning, 1000 mg at bedtime     amLODipine (NORVASC) 5 MG tablet TAKE 1 TABLET BY MOUTH TWICE A DAY 180 tablet 3   apixaban (ELIQUIS) 5 MG TABS tablet Take 1 tablet (5 mg total) by mouth 2 (two) times daily. 60 tablet 5   Ascorbic Acid (VITAMIN C) 500 MG CHEW Chew 1 each by mouth daily.     atorvastatin (LIPITOR) 20 MG tablet Take 20 mg by mouth daily.     bisoprolol (ZEBETA) 5 MG tablet Take 1 tablet (5 mg total) by mouth 2 (two) times daily. 180 tablet 3   clobetasol cream (TEMOVATE) 0.05 % Apply 1 Application topically 2 (two) times daily.     desoximetasone (TOPICORT) 0.25 % cream  SMARTSIG:Sparingly Topical Twice Daily PRN     ferrous sulfate 325 (65 FE) MG tablet Take 325 mg by mouth daily.     fluticasone (FLONASE) 50 MCG/ACT nasal spray Place 1 spray into both nostrils daily as needed for allergies.     loratadine (CLARITIN) 10 MG tablet Take 10 mg by mouth daily.     montelukast (SINGULAIR) 10 MG tablet Take 10 mg by mouth at bedtime as needed (wheezing).     olmesartan (BENICAR) 40 MG tablet Take 20 mg by mouth in the morning and at bedtime.     omeprazole (PRILOSEC) 20 MG capsule Take 20 mg by mouth daily.     OXYGEN Place 2 L into the nose at bedtime.     triamcinolone cream (KENALOG) 0.1 % Apply 1 Application topically daily as needed (dry skin).     No current facility-administered medications for this visit.     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   Hyperlipidemia    Hypertension    Iron deficiency anemia 02/05/2018   Wears dentures    full upper dentures and partial lower     Past Surgical History:  Procedure Laterality Date   CARDIOVERSION N/A 05/10/2022   Procedure: CARDIOVERSION;  Surgeon: Quintella Reicherturner, Traci R, MD;  [facility];  Service: Cardiovascular;  Laterality: N/A;   COLONOSCOPY  05/14/2009   Gessner/ adenoma 2007   COLONOSCOPY  05/2014   Dr Marcelene ButteHurrelbrink -[facility] - polyps   TOTAL HIP ARTHROPLASTY Right 2017   UPPER GASTROINTESTINAL ENDOSCOPY     vagiinal hysterectomy  1977   WISDOM TOOTH EXTRACTION      Social History   Socioeconomic  History   Marital status: Widowed    Spouse name: Not on file   Number of children: 1   Years of education: Not on file   Highest education level: Not on file  Occupational History   Not on file  Tobacco Use   Smoking status: Former    Years: 10    Types: Cigarettes    Quit date: 1968    Years since quitting: 56.3   Smokeless tobacco: Never  Vaping Use   Vaping Use: Never used  Substance and Sexual Activity   Alcohol use: Never   Drug use: Never   Sexual activity: Not Currently    Birth control/protection: Post-menopausal  Other Topics Concern   Not on file  Social History Narrative   Married   Former smoker, no alcohol no drug use   Social Determinants of Corporate investment bankerHealth   Financial Resource Strain: Not on file  Food Insecurity: Not on file  Transportation Needs: Not on file  Physical Activity: Not on file  Stress: Not on file  Social Connections: Not on file  Intimate Partner Violence: Not on file    Family History  Problem Relation Age of Onset   Heart attack Father    Heart disease Sister    Heart disease Sister    Rectal cancer Neg Hx    Stomach cancer Neg Hx     ROS: no fevers or chills, productive cough, hemoptysis, dysphasia, odynophagia, melena, hematochezia, dysuria, hematuria, rash, seizure activity, orthopnea, PND, pedal edema, claudication. Remaining systems are negative.  Physical Exam: Well-developed well-nourished in no acute distress.  Skin is warm and dry.  HEENT is normal.  Neck is supple.  Chest is clear to auscultation with normal expansion.  Cardiovascular exam is regular rate and rhythm.  Abdominal exam nontender or distended. No masses palpated. Extremities show no edema. neuro grossly intact  ECG-sinus bradycardia at a rate of 58, left ventricular hypertrophy.  Personally reviewed  A/P  1 paroxysmal atrial fibrillation-patient continues to have episodes of atrial fibrillation.  We again discussed Tikosyn which was recommended by Dr.  Lalla BrothersLambert.  He did not feel she would be a good candidate for ablation or watchman.  She is agreeable to be seen in the atrial fibrillation clinic for consideration of initiation of Tikosyn.  Continue bisoprolol and anticoagulation.  2 palpitations-continue beta-blocker.  3 hypertension-blood pressure controlled.  Continue present medical regimen.  4 hyperlipidemia continue statin.  Olga MillersBrian Reyansh Kushnir, MD

## 2022-10-24 ENCOUNTER — Ambulatory Visit (INDEPENDENT_AMBULATORY_CARE_PROVIDER_SITE_OTHER): Payer: Medicare Other | Admitting: Cardiology

## 2022-10-24 ENCOUNTER — Encounter: Payer: Self-pay | Admitting: Cardiology

## 2022-10-24 VITALS — BP 120/60 | HR 93 | Ht 65.5 in | Wt 186.0 lb

## 2022-10-24 DIAGNOSIS — R002 Palpitations: Secondary | ICD-10-CM | POA: Diagnosis not present

## 2022-10-24 DIAGNOSIS — I10 Essential (primary) hypertension: Secondary | ICD-10-CM | POA: Diagnosis not present

## 2022-10-24 DIAGNOSIS — E7800 Pure hypercholesterolemia, unspecified: Secondary | ICD-10-CM

## 2022-10-24 DIAGNOSIS — I480 Paroxysmal atrial fibrillation: Secondary | ICD-10-CM

## 2022-10-24 NOTE — Patient Instructions (Signed)
    Follow-Up: At [facility], you and your health needs are our priority.  As part of our continuing mission to provide you with exceptional heart care, we have created designated Provider Care Teams.  These Care Teams include your primary Cardiologist (physician) and Advanced Practice Providers (APPs -  Physician Assistants and Nurse Practitioners) who all work together to provide you with the care you need, when you need it.  We recommend signing up for the patient portal called "MyChart".  Sign up information is provided on this After Visit Summary.  MyChart is used to connect with patients for Virtual Visits (Telemedicine).  Patients are able to view lab/test results, encounter notes, upcoming appointments, etc.  Non-urgent messages can be sent to your provider as well.   To learn more about what you can do with MyChart, go to [email or web address].    Your next appointment:   4-5 month(s)  Provider:   Olga Millers, MD

## 2022-11-18 ENCOUNTER — Ambulatory Visit (HOSPITAL_COMMUNITY): Payer: Medicare Other | Admitting: Physician Assistant

## 2022-11-18 ENCOUNTER — Ambulatory Visit: Payer: Medicare Other | Admitting: Podiatry

## 2022-11-23 ENCOUNTER — Ambulatory Visit (HOSPITAL_COMMUNITY)
Admission: RE | Admit: 2022-11-23 | Discharge: 2022-11-23 | Disposition: A | Discharge status: Home / Self Care | Payer: Medicare Other | Source: Ambulatory Visit | Attending: Physician Assistant | Admitting: Physician Assistant

## 2022-11-23 VITALS — BP 166/72 | HR 55 | Ht 65.5 in | Wt 185.2 lb

## 2022-11-23 DIAGNOSIS — I10 Essential (primary) hypertension: Secondary | ICD-10-CM | POA: Diagnosis not present

## 2022-11-23 DIAGNOSIS — I4819 Other persistent atrial fibrillation: Secondary | ICD-10-CM | POA: Insufficient documentation

## 2022-11-23 DIAGNOSIS — E119 Type 2 diabetes mellitus without complications: Secondary | ICD-10-CM | POA: Diagnosis not present

## 2022-11-23 DIAGNOSIS — E785 Hyperlipidemia, unspecified: Secondary | ICD-10-CM | POA: Insufficient documentation

## 2022-11-23 DIAGNOSIS — D6869 Other thrombophilia: Secondary | ICD-10-CM | POA: Insufficient documentation

## 2022-11-23 DIAGNOSIS — Z87891 Personal history of nicotine dependence: Secondary | ICD-10-CM | POA: Diagnosis not present

## 2022-11-23 NOTE — Progress Notes (Signed)
Primary Care Physician: Leola Brazil, DO Primary Cardiologist: Dr Jens Som Primary Electrophysiologist: Dr Lalla Brothers Referring Physician: Dr Velia Meyer is a 82 y.o. female with a history of HTN, HLD, DM, atrial fibrillation who presents for follow up in the [facility]. She had an episode of atrial fibrillation and underwent cardioversion October 01, 2020 and has been treated with amiodarone.  Amiodarone was discontinued by pulmonary ultimately due to history of bronchiectasis. Monitor January 2023 showed sinus rhythm with brief runs of PAT, PACs and PVCs and 1 triplet. Patient seen in the [facility] emergency room March 22 with complaints of palpitations and noted to have PVCs.  Patient is on Eliquis for a CHADS2VASC score of 7. She was seen by Dr Jens Som 05/09/22 and was found to be back in afib. She had repeat DCCV on 05/10/22.   Patient seen in consult by Dr Lalla Brothers on 06/08/22, not felt to be a candidate for ablation or Watchman due to chronic supplemental O2 use. She was seen at the ED at [facility] with afib with RVR. She converted to SR after a dose of Lopressor. There were no specific triggers that she could identify.   Today, she denies symptoms of palpitations, chest pain, shortness of breath, orthopnea, PND, lower extremity edema, dizziness, presyncope, syncope, snoring, daytime somnolence, bleeding, or neurologic sequela. The patient is tolerating medications without difficulties and is otherwise without complaint today.    Atrial Fibrillation Risk Factors:  she does not have symptoms or diagnosis of sleep apnea. she does not have a history of rheumatic fever.   she has a BMI of Body mass index is 30.35 kg/m.Marland Kitchen Filed Weights   11/23/22 1523  Weight: 84 kg   Family History  Problem Relation Age of Onset   Heart attack Father    Heart disease Sister    Heart disease Sister    Rectal cancer Neg Hx    Stomach cancer Neg Hx       Atrial Fibrillation Management history:  Previous antiarrhythmic drugs: amiodarone  Previous cardioversions: 10/01/20, 05/10/22 Previous ablations: none CHADS2VASC score: 7 Anticoagulation history: Eliquis   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   Hyperlipidemia    Hypertension    Iron deficiency anemia 02/05/2018   Wears dentures    full upper dentures and partial lower    Past Surgical History:  Procedure Laterality Date   CARDIOVERSION N/A 05/10/2022   Procedure: CARDIOVERSION;  Surgeon: Quintella Reichert, MD;  Location: [facility] ENDOSCOPY;  Service: Cardiovascular;  Laterality: N/A;   COLONOSCOPY  05/14/2009   Gessner/ adenoma 2007   COLONOSCOPY  05/2014   Dr Marcelene Butte -[facility] - polyps   TOTAL HIP ARTHROPLASTY Right 2017   UPPER GASTROINTESTINAL ENDOSCOPY     vagiinal hysterectomy  1977   WISDOM TOOTH EXTRACTION      Current Outpatient Medications  Medication Sig Dispense Refill   acetaminophen (TYLENOL) 500 MG tablet Take 1,000-1,500 mg by mouth See admin instructions. 1500 mg in the morning, 1000 mg at bedtime     amLODipine (NORVASC) 5 MG tablet TAKE 1 TABLET BY MOUTH TWICE A DAY (Patient taking differently: Taking 1/2 tablet in the am and 1 tablet at night) 180 tablet 3   apixaban (ELIQUIS) 5 MG TABS tablet Take 1 tablet (5 mg total) by mouth 2 (two) times daily. 60 tablet 5   atorvastatin (LIPITOR) 20 MG tablet Take 20 mg by mouth daily.     bisoprolol (ZEBETA) 5 MG  tablet Take 1 tablet (5 mg total) by mouth 2 (two) times daily. 180 tablet 3   clobetasol cream (TEMOVATE) 0.05 % Apply 1 Application topically 2 (two) times daily.     fluticasone (FLONASE) 50 MCG/ACT nasal spray Place 1 spray into both nostrils daily as needed for allergies.     loratadine (CLARITIN) 10 MG tablet Take 10 mg by mouth as needed.     montelukast (SINGULAIR) 10 MG tablet Take 10 mg by mouth at bedtime as needed (wheezing).     olmesartan (BENICAR) 40 MG tablet Take 20 mg by mouth in the morning and at bedtime.     omeprazole (PRILOSEC) 20 MG capsule Take 20 mg by mouth daily.     OXYGEN Place 2 L into the nose at bedtime.     triamcinolone cream (KENALOG) 0.1 % Apply 1 Application topically daily as needed (dry skin).     No current facility-administered medications for this encounter.    Allergies  Allergen Reactions   Codeine Nausea And Vomiting    Causes low BP and vomiting, Caused skin to peel  Blood pressure drops   Cold Relief Plus  [Chlorphen-Phenyleph-Asa] Palpitations   Fexofenadine Hcl Palpitations   Ace Inhibitors     tachycardia   Epinephrine Palpitations   Metoclopramide Swelling    Causes jitters/anxiety    Social History   Socioeconomic History   Marital status: Widowed    Spouse name: Not on file   Number of children: 1   Years of education: Not on file   Highest education level: Not on file  Occupational History   Not on file  Tobacco Use   Smoking status: Former    Years: 10    Types: Cigarettes    Quit date: 1968    Years since quitting: 56.4   Smokeless tobacco: Never  Vaping Use   Vaping Use: Never used  Substance and Sexual Activity   Alcohol use: Never   Drug use: Never   Sexual activity: Not Currently    Birth control/protection: Post-menopausal  Other Topics Concern   Not on file  Social History Narrative   Married   Former smoker, no alcohol no drug use   Social Determinants of Corporate investment banker Strain: Not  on file  Food Insecurity: Not on file  Transportation Needs: Not on file  Physical Activity: Not on file  Stress: Not on file  Social Connections: Not on file  Intimate Partner Violence: Not on file     ROS- All systems are reviewed and negative except as per the HPI above.  Physical Exam: Vitals:   11/23/22 1523  BP: (!) 166/72  Pulse: (!) 55  Weight: 84 kg  Height: 5' 5.5" (1.664 m)     GEN- The patient is a well appearing elderly female, alert and oriented x 3 today.   HEENT-head  normocephalic, atraumatic, sclera clear, conjunctiva pink, hearing intact, trachea midline. Lungs- Clear to ausculation bilaterally, normal work of breathing Heart- Regular rate and rhythm, no murmurs, rubs or gallops  GI- soft, NT, ND, + BS Extremities- no clubbing, cyanosis, or edema MS- no significant deformity or atrophy Skin- no rash or lesion Psych- euthymic mood, full affect Neuro- strength and sensation are intact   Wt Readings from Last 3 Encounters:  11/23/22 84 kg  10/24/22 84.4 kg  09/26/22 78 kg    EKG today demonstrates  SB Vent. rate 55 BPM PR interval 156 ms QRS duration 76 ms QT/QTcB 430/411 ms  Epic records are reviewed at length today  CHA2DS2-VASc Score = 7  The patient's score is based upon: CHF History: 0 HTN History: 1 Diabetes History: 1 Stroke History: 2 (TIA per chart in care everywhere) Vascular Disease History: 0 Age Score: 2 Gender Score: 1       ASSESSMENT AND PLAN: 1. Persistent Atrial Fibrillation (ICD10:  I48.19) The patient's CHA2DS2-VASc score is 7, indicating a 11.2% annual risk of stroke.   Patient in SR today. We discussed rhythm control options today. Amiodarone discontinued due to lung issues. We specifically discussed dofetilide loading. Information sheet given. She wants to take time to consider her options and discuss with her family. Will revisit again in a month.    Continue Eliquis 5 mg BID  Continue bisoprolol 10 mg  daily  2. Secondary Hypercoagulable State (ICD10:  D68.69) The patient is at significant risk for stroke/thromboembolism based upon her CHA2DS2-VASc Score of 7.  Continue Apixaban (Eliquis). Not felt to be a candidate for Watchman.   3. HTN Elevated today, better on recheck. Well controlled at home. No changes today.    Follow up in the AF clinic as scheduled 6/19.    Jorja Loa PA-C Afib [facility] [address] [place], [place] [address] [phone] 11/23/2022 4:28 PM

## 2022-12-07 ENCOUNTER — Ambulatory Visit (HOSPITAL_COMMUNITY): Payer: Medicare Other | Admitting: Physician Assistant

## 2022-12-21 ENCOUNTER — Ambulatory Visit (HOSPITAL_COMMUNITY): Payer: Medicare Other | Admitting: Physician Assistant

## 2022-12-28 ENCOUNTER — Ambulatory Visit (HOSPITAL_COMMUNITY)
Admission: RE | Admit: 2022-12-28 | Discharge: 2022-12-28 | Disposition: A | Discharge status: Home / Self Care | Payer: Medicare Other | Source: Ambulatory Visit | Attending: Physician Assistant | Admitting: Physician Assistant

## 2022-12-28 ENCOUNTER — Encounter (HOSPITAL_COMMUNITY): Payer: Self-pay | Admitting: Physician Assistant

## 2022-12-28 VITALS — BP 118/62 | HR 61 | Ht 65.5 in | Wt 185.6 lb

## 2022-12-28 DIAGNOSIS — I4819 Other persistent atrial fibrillation: Secondary | ICD-10-CM | POA: Insufficient documentation

## 2022-12-28 DIAGNOSIS — I10 Essential (primary) hypertension: Secondary | ICD-10-CM | POA: Diagnosis not present

## 2022-12-28 DIAGNOSIS — D6869 Other thrombophilia: Secondary | ICD-10-CM | POA: Insufficient documentation

## 2022-12-28 DIAGNOSIS — Z79899 Other long term (current) drug therapy: Secondary | ICD-10-CM | POA: Diagnosis not present

## 2022-12-28 DIAGNOSIS — Z7901 Long term (current) use of anticoagulants: Secondary | ICD-10-CM | POA: Diagnosis not present

## 2022-12-28 NOTE — Progress Notes (Signed)
Primary Care Physician: [name] Brazil, DO Primary Cardiologist: Dr Jens Som Primary Electrophysiologist: Dr Lalla Brothers Referring Physician: Dr Velia Meyer is a 82 y.o. female with a history of HTN, HLD, DM, atrial fibrillation who presents for follow up in the [facility]. She had an episode of atrial fibrillation and underwent cardioversion October 01, 2020 and has been treated with amiodarone.  Amiodarone was discontinued by pulmonary ultimately due to history of bronchiectasis. Monitor January 2023 showed sinus rhythm with brief runs of PAT, PACs and PVCs and 1 triplet. Patient seen in the [facility] emergency room March 22 with complaints of palpitations and noted to have PVCs.  Patient is on Eliquis for a CHADS2VASC score of 7. She was seen by Dr Jens Som 05/09/22 and was found to be back in afib. She had repeat DCCV on 05/10/22.   Patient seen in consult by Dr Lalla Brothers on 06/08/22, not felt to be a candidate for ablation or Watchman due to chronic supplemental O2 use. She was seen at the ED at [facility] with afib with RVR. She converted to SR after a dose of Lopressor.   On follow up today, patient reports that she has done well since her last visit. She has not had any interim symptoms of afib. No bleeding issues on anticoagulation.   Today, she denies symptoms of palpitations, chest pain, shortness of breath, orthopnea, PND, lower extremity edema, dizziness, presyncope, syncope, snoring, daytime somnolence, bleeding, or neurologic sequela. The patient is tolerating medications without difficulties and is otherwise without complaint today.    Atrial Fibrillation Risk Factors:  she does not have symptoms or diagnosis of sleep apnea. she does not have a history of rheumatic fever.   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  Physical Exam: Vitals:   12/28/22 1515  BP: 118/62  Pulse: 61  Weight: 84.2 kg  Height: 5' 5.5" (1.664 m)    GEN: Well nourished, well developed in no acute distress NECK: No JVD; No carotid bruits CARDIAC: Regular rate and rhythm, no murmurs, rubs, gallops RESPIRATORY:  Clear to auscultation without rales, wheezing or rhonchi  ABDOMEN: Soft, non-tender, non-distended EXTREMITIES:  No edema; No deformity    Wt Readings from Last 3 Encounters:  12/28/22 84.2 kg  11/23/22 84 kg  10/24/22 84.4 kg    EKG today demonstrates  SR Vent. rate 61 BPM PR interval 156 ms QRS duration 74 ms QT/QTcB 414/416 ms  Epic records are reviewed at length  today  CHA2DS2-VASc Score = 7  The patient's score is based upon: CHF History: 0 HTN History: 1 Diabetes History: 1 Stroke History: 2 (TIA per chart in care everywhere) Vascular Disease History: 0 Age Score: 2 Gender Score: 1       ASSESSMENT AND PLAN: Persistent Atrial Fibrillation (ICD10:  I48.19) The patient's CHA2DS2-VASc score is 7, indicating a 11.2% annual risk of stroke.   Amiodarone discontinued due to lung issues.  We discussed dofetilide  loading again today. She has deferred dofetilide admission for now and would like to continue her present therapy. However, if she has more frequent afib episodes she would be amenable to starting the medication.  Continue Eliquis 5 mg BID  Continue bisoprolol 10 mg daily  Secondary Hypercoagulable State (ICD10:  D68.69) The patient is at significant risk for stroke/thromboembolism based upon her CHA2DS2-VASc Score of 7.  Continue Apixaban (Eliquis). Not felt to be a candidate for Watchman.   HTN Stable on current regimen   Follow up with Dr Jens Som as scheduled. AF clinic in 6 months.    Jorja Loa PA-C Afib Clinic [facility] [address] [place], [place] [address] [phone] 12/28/2022 3:39 PM

## 2023-01-10 ENCOUNTER — Other Ambulatory Visit: Payer: Self-pay | Admitting: Cardiology

## 2023-01-10 DIAGNOSIS — I480 Paroxysmal atrial fibrillation: Secondary | ICD-10-CM

## 2023-01-10 NOTE — Telephone Encounter (Signed)
Prescription refill request for Eliquis received. Indication:afib Last office visit:6/24 Scr:0.85  3/24 Age: 82 Weight:84.2  kg  Prescription refilled

## 2023-02-21 ENCOUNTER — Telehealth (HOSPITAL_COMMUNITY): Payer: Self-pay

## 2023-02-21 ENCOUNTER — Encounter: Payer: Self-pay | Admitting: Cardiology

## 2023-02-21 NOTE — Telephone Encounter (Signed)
Patient called in regarding her A-fib. Symptoms started this morning. HR has been fluctuating from 50's-90's. HR is mostly staying around 80's-90's. She had one high reading at 147. B/P is 139/101. Patient states she has taken 1/2 tablet extra of her bisoprolol around 9:30 am. Per Eyvonne Left instruct patient to take her Bisoprolol 5 mg- 1 tablet twice daily and she was told if her HR is over 100 she can take one extra 1/2 tablet of her Bisoprolol. She was advised to make sure if she takes extra Bisoprolol she should check her blood pressure prior to taking. Scheduled appointment for patient to come in on 8/28 @ 3:30 pm. She will contact our clinic back if she has further concerns. Consulted with patient and she verbalized understanding.

## 2023-02-21 NOTE — Telephone Encounter (Signed)
Error

## 2023-02-22 ENCOUNTER — Encounter: Payer: Self-pay | Admitting: Cardiovascular Disease

## 2023-02-22 ENCOUNTER — Telehealth: Payer: Self-pay | Admitting: Cardiology

## 2023-02-22 ENCOUNTER — Ambulatory Visit: Payer: Medicare Other | Attending: Cardiovascular Disease | Admitting: Cardiovascular Disease

## 2023-02-22 VITALS — BP 118/76 | HR 90 | Ht 65.0 in | Wt 180.2 lb

## 2023-02-22 DIAGNOSIS — I4819 Other persistent atrial fibrillation: Secondary | ICD-10-CM | POA: Diagnosis present

## 2023-02-22 DIAGNOSIS — I10 Essential (primary) hypertension: Secondary | ICD-10-CM | POA: Insufficient documentation

## 2023-02-22 NOTE — Telephone Encounter (Signed)
Patient reports 02 is 97% on room air.

## 2023-02-22 NOTE — Patient Instructions (Signed)
Medication Instructions:  Your physician has recommended you make the following change in your medication:  - Stop Amlodipine - Continue other medications as prescribed.   *If you need a refill on your cardiac medications before your next appointment, please call your pharmacy*    Follow-Up: At [facility], you and your health needs are our priority.  As part of our continuing mission to provide you with exceptional heart care, we have created designated Provider Care Teams.  These Care Teams include your primary Cardiologist (physician) and Advanced Practice Providers (APPs -  Physician Assistants and Nurse Practitioners) who all work together to provide you with the care you need, when you need it.  We recommend signing up for the patient portal called "MyChart".  Sign up information is provided on this After Visit Summary.  MyChart is used to connect with patients for Virtual Visits (Telemedicine).  Patients are able to view lab/test results, encounter notes, upcoming appointments, etc.  Non-urgent messages can be sent to your provider as well.   To learn more about what you can do with MyChart, go to [email or web address].    Your next appointment:   Appointment with Afib clinic Friday 02/24/23  Provider:   Olga Millers, MD     Other Instructions

## 2023-02-22 NOTE — Progress Notes (Signed)
Cardiology Office Note:   Date:  02/22/2023  NAME:  [name] [name]    MRN: [id number] DOB:  10-19-1940   PCP:  Leola Brazil, DO  Cardiologist:  Olga Millers, MD  Electrophysiologist:  Lanier Prude, MD   Referring MD: Leola Brazil, DO   Chief Complaint  Patient presents with   Atrial Fibrillation    History of Present Illness:   [name] [name] is a 82 y.o. female with a hx of hypertension, hyperlipidemia, persistent atrial fibrillation status post multiple cardioversions who presents for follow-up.  She presents with her daughter.  She reports she has been in A-fib for over 24 hours.  She describes heart racing as well as shortness of breath with activity.  Her blood pressure also has been a bit low.  Her EKG shows A-fib heart rate 90s.  Lungs are clear.  Blood pressure is stable.  She is contemplating Tikosyn and now considering this.  She apparently has had 2 episodes of A-fib in March.  She reports they lasted most of the day and actually resolved without intervention.  Last cardioversion was October 2023.  She is on bisoprolol.  We did discuss that there is a strong possibility she may go back into rhythm.  We will have her set up to see the A-fib clinic for Tikosyn loading.  She may go back into rhythm before that time.  If she does not they could consider cardioversion at that time.  Given her history of A-fib in March that went back into rhythm spontaneously I think she does merit an attempt to see if she will do this on her own.  She reports no fevers or chills.  No other changes in her symptoms.  She does have bronchiectasis but those symptoms are stable.   Problem List Persistent Afib -DCCV 11/2020 & 04/2022 -amio dc'ed due to bronchiectasis 2. HTN 3. 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   Hyperlipidemia    Hypertension    Iron deficiency anemia 02/05/2018   Wears dentures    full upper dentures and partial lower     Past Surgical History: Past Surgical History:  Procedure Laterality Date   CARDIOVERSION N/A 05/10/2022   Procedure: CARDIOVERSION;  Surgeon: Quintella Reichert, MD;  Location: [facility] ENDOSCOPY;  Service: Cardiovascular;  Laterality: N/A;   COLONOSCOPY  05/14/2009   Gessner/ adenoma 2007   COLONOSCOPY  05/2014   Dr Marcelene Butte [facility] - polyps   TOTAL HIP ARTHROPLASTY Right 2017   UPPER GASTROINTESTINAL ENDOSCOPY     vagiinal hysterectomy  1977   WISDOM TOOTH EXTRACTION      Current Medications: Current Meds  Medication Sig   acetaminophen (TYLENOL) 500 MG tablet Take 1,000-1,500 mg by mouth See admin instructions. 1500 mg in the morning, 1000 mg at bedtime   atorvastatin (LIPITOR) 20 MG tablet Take  20 mg by mouth daily.   bisoprolol (ZEBETA) 5 MG tablet Take 1 tablet (5 mg total) by mouth 2 (two) times daily.   ELIQUIS 5 MG TABS tablet TAKE 1 TABLET BY MOUTH TWICE A DAY   fluticasone (FLONASE) 50 MCG/ACT nasal spray Place 1 spray into both nostrils daily as needed for allergies.   loratadine (CLARITIN) 10 MG tablet Take 10 mg by mouth as needed.   olmesartan (BENICAR) 40 MG tablet Take 20 mg by mouth in the morning and at bedtime.   omeprazole (PRILOSEC) 20 MG capsule Take 20 mg by mouth daily.   OXYGEN Place 2 L into the nose at bedtime.   [DISCONTINUED] amLODipine (NORVASC) 5 MG tablet TAKE 1 TABLET BY MOUTH TWICE A DAY (Patient taking differently: Taking 1/2 tablet in the am and 1 tablet at night)      Allergies:    Codeine, Cold relief plus  [chlorphen-phenyleph-asa], Fexofenadine hcl, Ace inhibitors, Epinephrine, and Metoclopramide   Social History: Social History   Socioeconomic History   Marital status: Widowed    Spouse name: Not on file   Number of children: 1   Years of education: Not on file   Highest education level: Not on file  Occupational History   Not on file  Tobacco Use   Smoking status: Former    Current packs/day: 0.00    Types: Cigarettes    Start date: 28    Quit date: 1968    Years since quitting: 56.6   Smokeless tobacco: Never  Vaping Use   Vaping status: Never Used  Substance and Sexual Activity   Alcohol use: Never   Drug use: Never   Sexual activity: Not Currently    Birth control/protection: Post-menopausal  Other Topics Concern   Not on file  Social History Narrative   Married   Former smoker, no alcohol no drug use   Social Determinants of Health   Financial Resource Strain: Low Risk  (03/01/2022)   [facility] prior to 09/10/[facility]22[facility]   Overall Financial Resource Strain (CARDIA)    Difficulty of Paying Living Expenses: Not very hard  Food Insecurity: Low Risk  (01/06/2023)   [facility]   Food vital sign    Within the past 12 months, you worried that your food would run out before you got money to buy more: Never true    Within the past 12 months, the food you bought just didn't last and you didn't have money to get more. : Never true  Transportation Needs: Not on file (01/06/2023)  Physical Activity: Insufficiently Active (03/01/2022)   [facility] prior to 09/10/2022., [facility] visits prior to 09/10/2022[facility]   Exercise Vital Sign    Days of Exercise per Week: 1 day    Minutes of  Exercise per Session: 30 min  Stress: No Stress Concern Present (03/01/2022)   [facility] prior to 09/10/2022[facility] visits prior to 09/10/2022., [facility]   [employer] of Occupational Health - Occupational Stress Questionnaire    Feeling of Stress : Only a little  Social Connections: Moderately Integrated (03/01/2022)   [facility] from [facility] prior to 09/10/2022., [facility] visits prior to 09/10/2022.,  [facility] prior to 09/10/2022[facility]   Social Connection and Isolation Panel [NHANES]    Frequency of Communication with Friends and Family: More than three times a week    Frequency of Social Gatherings with Friends and Family: Twice a week    Attends Religious Services: More than 4 times per year    Active Member of [employer] or Organizations: Not on file    Attends Banker Meetings: More than 4 times per year    Marital Status: Widowed     Family History: The patient's family history includes Heart attack in her father; Heart disease in her sister and sister. There is no history of Rectal cancer or Stomach cancer.  ROS:   All other ROS reviewed and negative. Pertinent positives noted in the HPI.     EKGs/Labs/Other Studies Reviewed:   The following studies were personally reviewed by me today:  EKG:  EKG is ordered today.    EKG Interpretation Date/Time:  Wednesday February 22 2023 13:57:55 EDT Ventricular Rate:  92 PR Interval:    QRS Duration:  76 QT Interval:  338 QTC Calculation: 417 R Axis:   63  Text Interpretation: Atrial fibrillation Nonspecific T wave abnormality Confirmed by Lennie Odor [phone]) on 02/22/2023 1:59:55 PM   Recent Labs: 05/16/2022: Hemoglobin 11.4; Platelets 264 05/20/2022: ALT 9; BUN 12; Creat 0.85; Magnesium 1.8; Potassium 4.6;  Sodium 136   Recent Lipid Panel No results found for: "CHOL", "TRIG", "HDL", "CHOLHDL", "VLDL", "LDLCALC", "LDLDIRECT"  Physical Exam:   VS:  BP 118/76 (BP Location: Left Arm, Patient Position: Sitting, Cuff Size: Normal)   Pulse 90   Ht 5\' 5"  (1.651 m)   Wt 180 lb 3.2 oz (81.7 kg)   SpO2 98%   BMI 29.99 kg/m    Wt Readings from Last 3 Encounters:  02/22/23 180 lb 3.2 oz (81.7 kg)  12/28/22 185 lb 9.6 oz (84.2 kg)  11/23/22 185 lb 3.2 oz (84 kg)    General: Well nourished, well developed, in no acute distress Head: Atraumatic, normal size  Eyes: PEERLA, EOMI  Neck: Supple, no JVD Endocrine: No thryomegaly Cardiac: Normal S1, S2; RRR; no murmurs, rubs, or gallops Lungs: Clear to auscultation bilaterally, no wheezing, rhonchi or rales  Abd: Soft, nontender, no hepatomegaly  Ext: No edema, pulses 2+ Musculoskeletal: No deformities, BUE and BLE strength normal and equal Skin: Warm and dry, no rashes   Neuro: Alert and oriented to person, place, time, and situation, CNII-XII grossly intact, no focal deficits  Psych: Normal mood and affect   ASSESSMENT:   [name] [name] is a 82 y.o. female who presents for the following: 1. Persistent atrial fibrillation (HCC)   2. Primary hypertension     PLAN:   1. Persistent atrial fibrillation (HCC) -Recurrent A-fib.  Heart rate in the 90s today.  She is symptomatic from this.  We continue bisoprolol for now.  BP has been a bit soft.  We will stop her amlodipine.  She will continue Eliquis.  She has had 2 episodes of A-fib in March of this year.  They resolved spontaneously.  She is also likely planning for Tikosyn loading.  We discussed she may go back into sinus rhythm in the next day or so.  I would like to set her up to see the A-fib clinic Friday.  If she is still in A-fib they could consider cardioversion versus admission to the hospital in the next  week or so for Tikosyn loading and cardioversion at that time.  I am a bit reluctant to  recommend cardioversion today as she has gone back into rhythm on her own most recently.  She will continue Eliquis for now.  She will see me back as needed and follow-up with Dr. Jens Som in the A-fib clinic moving forward.  2. Primary hypertension -Stop amlodipine.  Continue current medications.  Disposition: Return if symptoms worsen or fail to improve.  Medication Adjustments/Labs and Tests Ordered: Current medicines are reviewed at length with the patient today.  Concerns regarding medicines are outlined above.  Orders Placed This Encounter  Procedures   EKG 12-Lead   No orders of the defined types were placed in this encounter.  Patient Instructions  Medication Instructions:  Your physician has recommended you make the following change in your medication:  - Stop Amlodipine - Continue other medications as prescribed.   *If you need a refill on your cardiac medications before your next appointment, please call your pharmacy*    Follow-Up: At [facility], you and your health needs are our priority.  As part of our continuing mission to provide you with exceptional heart care, we have created designated Provider Care Teams.  These Care Teams include your primary Cardiologist (physician) and Advanced Practice Providers (APPs -  Physician Assistants and Nurse Practitioners) who all work together to provide you with the care you need, when you need it.  We recommend signing up for the patient portal called "MyChart".  Sign up information is provided on this After Visit Summary.  MyChart is used to connect with patients for Virtual Visits (Telemedicine).  Patients are able to view lab/test results, encounter notes, upcoming appointments, etc.  Non-urgent messages can be sent to your provider as well.   To learn more about what you can do with MyChart, go to [email or web address].    Your next appointment:   Appointment with Afib clinic Friday 02/24/23  Provider:   Olga Millers, MD     Other Instructions     Time Spent with Patient: I have spent a total of 35 minutes with patient reviewing hospital notes, telemetry, EKGs, labs and examining the patient as well as establishing an assessment and plan that was discussed with the patient.  > 50% of time was spent in direct patient care.  Signed, Lenna Gilford. Flora Lipps, MD, [facility]  [facility]  [address], [address] [place], [place] [address] [phone]  02/22/2023 2:34 PM

## 2023-02-22 NOTE — Telephone Encounter (Signed)
Spoke to patient regarding her concerns that she is back in afib. Patient last seen by afib clinic on 12/28/22. Next afib clinic appt scheduled for 03/08/23. ECG from last visit in June states NSR. Patient states that she thinks her afib restarted yesterday morning. She denies SOB, chest pain, lightheadedness or palpitations. However, she states when she woke up early this morning she felt nauseated and noticed her BP was lower than normal at 108/74. She was afraid to take her zebeta due to this lower BP reading.  Since then, patient has eaten breakfast, which resolved her nausea. On recheck her BP is 126/93. HR 97. Advised patient to go ahead and take her zebeta. Appt made for Dr. Flora Lipps, DOD at [facility] today at 1:40 pm. Patient states she would like to hold off on taking her amlodipine and benicar til she talks to MD today.

## 2023-02-22 NOTE — Telephone Encounter (Signed)
Patient c/o Palpitations:  High priority if patient c/o lightheadedness, shortness of breath, or chest pain  How long have you had palpitations/irregular HR/ Afib? Are you having the symptoms now? Yesterday Morning  Are you currently experiencing lightheadedness, SOB or CP? Nauseous,   Do you have a history of afib (atrial fibrillation) or irregular heart rhythm? Yes  Have you checked your BP or HR? (document readings if available): This morning before BP medication 108/74 HR 84  Are you experiencing any other symptoms? Patient stated that yesterday morning she took her BP and realized she was in AFIB. Patient denies CP, SOB or Lightheadedness. Patient states her heart rate fluctuates up and down. Patient is very concerned. Please advise.

## 2023-02-24 ENCOUNTER — Encounter (HOSPITAL_COMMUNITY): Payer: Self-pay | Admitting: Physician Assistant

## 2023-02-24 ENCOUNTER — Other Ambulatory Visit (HOSPITAL_COMMUNITY): Payer: Self-pay | Admitting: Physician Assistant

## 2023-02-24 ENCOUNTER — Encounter: Payer: Self-pay | Admitting: Cardiovascular Disease

## 2023-02-24 ENCOUNTER — Ambulatory Visit (HOSPITAL_COMMUNITY)
Admission: RE | Admit: 2023-02-24 | Discharge: 2023-02-24 | Disposition: A | Discharge status: Home / Self Care | Payer: Medicare Other | Source: Ambulatory Visit | Attending: Physician Assistant | Admitting: Physician Assistant

## 2023-02-24 VITALS — BP 112/82 | HR 98 | Ht 65.0 in | Wt 180.0 lb

## 2023-02-24 DIAGNOSIS — Z7901 Long term (current) use of anticoagulants: Secondary | ICD-10-CM | POA: Insufficient documentation

## 2023-02-24 DIAGNOSIS — I4819 Other persistent atrial fibrillation: Secondary | ICD-10-CM | POA: Diagnosis not present

## 2023-02-24 DIAGNOSIS — E1136 Type 2 diabetes mellitus with diabetic cataract: Secondary | ICD-10-CM | POA: Insufficient documentation

## 2023-02-24 DIAGNOSIS — Z79899 Other long term (current) drug therapy: Secondary | ICD-10-CM | POA: Insufficient documentation

## 2023-02-24 DIAGNOSIS — E785 Hyperlipidemia, unspecified: Secondary | ICD-10-CM | POA: Diagnosis not present

## 2023-02-24 DIAGNOSIS — D6869 Other thrombophilia: Secondary | ICD-10-CM

## 2023-02-24 DIAGNOSIS — I10 Essential (primary) hypertension: Secondary | ICD-10-CM | POA: Diagnosis not present

## 2023-02-24 LAB — BASIC METABOLIC PANEL
Anion gap: 10 (ref 5–15)
BUN: 16 mg/dL (ref 8–23)
CO2: 24 mmol/L (ref 22–32)
Calcium: 8.5 mg/dL — ABNORMAL LOW (ref 8.9–10.3)
Chloride: 99 mmol/L (ref 98–111)
Creatinine, Ser: 0.95 mg/dL (ref 0.44–1.00)
GFR, Estimated: 59 mL/min — ABNORMAL LOW (ref >60)
Glucose, Bld: 109 mg/dL — ABNORMAL HIGH (ref 70–99)
Potassium: 5 mmol/L (ref 3.5–5.1)
Sodium: 133 mmol/L — ABNORMAL LOW (ref 135–145)

## 2023-02-24 LAB — CBC
HCT: 37.7 % (ref 36.0–46.0)
Hemoglobin: 12.1 g/dL (ref 12.0–15.0)
MCH: 29.5 pg (ref 26.0–34.0)
MCHC: 32.1 g/dL (ref 30.0–36.0)
MCV: 92 fL (ref 80.0–100.0)
Platelets: 240 10*3/uL (ref 150–400)
RBC: 4.1 MIL/uL (ref 3.87–5.11)
RDW: 13 % (ref 11.5–15.5)
WBC: 6.2 10*3/uL (ref 4.0–10.5)
nRBC: 0 % (ref 0.0–0.2)

## 2023-02-24 NOTE — Progress Notes (Signed)
Primary Care Physician: Leola Brazil, DO Primary Cardiologist: Dr Jens Som Primary Electrophysiologist: Dr Lalla Brothers Referring Physician: Dr Velia Meyer is a 82 y.o. female with a history of HTN, HLD, DM, atrial fibrillation who presents for follow up in the [facility]. She had an episode of atrial fibrillation and underwent cardioversion October 01, 2020 and has been treated with amiodarone.  Amiodarone was discontinued by pulmonary ultimately due to history of bronchiectasis. Monitor January 2023 showed sinus rhythm with brief runs of PAT, PACs and PVCs and 1 triplet. Patient seen in the [facility] emergency room March 22 with complaints of palpitations and noted to have PVCs.  Patient is on Eliquis for a CHADS2VASC score of 7. She was seen by Dr Jens Som 05/09/22 and was found to be back in afib. She had repeat DCCV on 05/10/22.   Patient seen in consult by Dr Lalla Brothers on 06/08/22, not felt to be a candidate for ablation or Watchman due to chronic supplemental O2 use. She was seen at the ED at [facility] with afib with RVR. She converted to SR after a dose of Lopressor.   On follow up today, patient reports that she went into afib on 8/13 with symptoms of fatigue. She was seen by Dr Flora Lipps 8/14 and ECG confirmed she was back in afib. She remains in afib today. There were no specific triggers that she could identify.   Today, she denies symptoms of palpitations, chest pain, shortness of breath, orthopnea, PND, lower extremity edema, dizziness, presyncope, syncope, snoring, daytime somnolence, bleeding, or neurologic sequela. The patient is tolerating medications without difficulties and is otherwise without complaint today.    Atrial Fibrillation Risk Factors:  she does not have symptoms or diagnosis of sleep apnea. she does not have a history of rheumatic fever.   Atrial Fibrillation Management history:  Previous antiarrhythmic drugs:  amiodarone  Previous cardioversions: 10/01/20, 05/10/22 Previous ablations: none Anticoagulation history: Eliquis   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  Physical Exam: Vitals:   02/24/23 0903  BP: 112/82  Pulse: 98  Weight: 81.6 kg  Height: 5\' 5"  (1.651 m)     GEN: Well nourished, well developed in no acute distress NECK: No JVD; No carotid bruits CARDIAC: Irregularly irregular rate and rhythm, no murmurs, rubs, gallops RESPIRATORY:  Clear to auscultation without rales, wheezing or rhonchi  ABDOMEN: Soft, non-tender, non-distended EXTREMITIES:  No edema; No deformity    Wt Readings from Last 3 Encounters:  02/24/23 81.6 kg  02/22/23 81.7 kg  12/28/22 84.2 kg    EKG today demonstrates  Afib Vent. rate 98  BPM PR interval * ms QRS duration 78 ms QT/QTcB 338/431 ms  Epic records are reviewed at length today  CHA2DS2-VASc Score = 7  The patient's score is based upon: CHF History: 0 HTN History: 1 Diabetes History: 1 Stroke History: 2 (TIA per chart in care everywhere) Vascular Disease History: 0 Age Score: 2 Gender Score: 1       ASSESSMENT AND PLAN: Persistent Atrial Fibrillation (ICD10:  I48.19) The patient's CHA2DS2-VASc score is 7, indicating a 11.2% annual  risk of stroke.   Amiodarone discontinued due to lung issues.  We discussed dofetilide loading again today. She again declines admission and would like to proceed with DCCV alone.  Continue Eliquis 5 mg BID, no missed doses in the previous 3 weeks.  Continue bisoprolol 10 mg daily  Secondary Hypercoagulable State (ICD10:  D68.69) The patient is at significant risk for stroke/thromboembolism based upon her CHA2DS2-VASc Score of 7.  Continue Apixaban (Eliquis). Not felt to be a candidate for Watchman due to supplemental O2 use.   HTN Stable on current regimen, currently off amlodipine while in afib.   Follow up with Dr Jens Som and in the AF clinic as scheduled.    Informed Consent   Shared Decision Making/Informed Consent The risks (stroke, cardiac arrhythmias rarely resulting in the need for a temporary or permanent pacemaker, skin irritation or burns and complications associated with conscious sedation including aspiration, arrhythmia, respiratory failure and death), benefits (restoration of normal sinus rhythm) and alternatives of a direct current cardioversion were explained in detail to Ms. Schnick and she agrees to proceed.       Jorja Loa PA-C Afib Clinic [facility] [address] [place], [place] [address] [phone] 02/24/2023 9:25 AM

## 2023-02-24 NOTE — Patient Instructions (Signed)
Cardioversion scheduled for:August 21st 2024   - Arrive at the [facility] and go to admitting at 12:00pm   - Do not eat or drink anything after midnight the night prior to your procedure.   - Take all your morning medication (except diabetic medications) with a sip of water prior to arrival.  - You will not be able to drive home after your procedure.    - Do NOT miss any doses of your blood thinner - if you should miss a dose please notify our office immediately.   - If you feel as if you go back into normal rhythm prior to scheduled cardioversion, please notify our office immediately.   If your procedure is canceled in the cardioversion suite you will be charged a cancellation fee.

## 2023-02-28 ENCOUNTER — Ambulatory Visit (HOSPITAL_COMMUNITY): Admission: RE | Admit: 2023-02-28 | Payer: Medicare Other | Source: Ambulatory Visit | Admitting: Internal Medicine

## 2023-02-28 DIAGNOSIS — I480 Paroxysmal atrial fibrillation: Secondary | ICD-10-CM

## 2023-02-28 NOTE — Progress Notes (Addendum)
ECG today shows sinus rhythm.  Agree with cancellation of DCCV.

## 2023-02-28 NOTE — Addendum Note (Signed)
Encounter addended by: Eustace Pen, PA-C on: 02/28/2023 3:08 PM  Actions taken: Clinical Note Signed

## 2023-02-28 NOTE — Addendum Note (Signed)
Encounter addended by: Eustace Pen, PA-C on: 02/28/2023 3:14 PM  Actions taken: Clinical Note Signed

## 2023-03-01 ENCOUNTER — Encounter (HOSPITAL_COMMUNITY): Admission: RE | Payer: Self-pay | Source: Home / Self Care

## 2023-03-01 ENCOUNTER — Ambulatory Visit (HOSPITAL_COMMUNITY): Admission: RE | Admit: 2023-03-01 | Payer: Medicare Other | Source: Home / Self Care | Admitting: Cardiology

## 2023-03-01 SURGERY — CARDIOVERSION
Anesthesia: Monitor Anesthesia Care

## 2023-03-08 ENCOUNTER — Ambulatory Visit (HOSPITAL_COMMUNITY): Payer: Medicare Other | Admitting: Physician Assistant

## 2023-03-10 NOTE — Progress Notes (Signed)
HPI: FU atrial fibrillation.  Patient previously followed at [facility].  She had an episode of atrial fibrillation and underwent cardioversion October 01, 2020 and has been treated with amiodarone. Amiodarone was discontinued by pulmonary ultimately due to history of bronchiectasis. Echocardiogram January 2022 showed normal LV function.  Monitor January 2023 showed sinus rhythm with brief runs of PAT, PACs and PVCs and 1 triplet.  Patient has contacted the office multiple times due to perceived side effects from apixaban and Xarelto. Patient was seen by Dr. Lalla Brothers for consideration of ablation and felt not to be a good candidate.  He recommended dofetilide therapy if she has recurrences.  He also felt that given supplemental oxygen use she would be at prohibitive risk for watchman implant.  Has been seen multiple times with recurrent atrial fibrillation.  She has declined dofetilide at this point.  Since last seen, patient denies dyspnea, chest pain, palpitations or syncope.  Current Outpatient Medications  Medication Sig Dispense Refill   acetaminophen (TYLENOL) 500 MG tablet Take 1,000-1,500 mg by mouth See admin instructions. Take 1500 mg in the morning, 1000 mg at bedtime     amLODipine (NORVASC) 5 MG tablet Take 5 mg by mouth daily.     atorvastatin (LIPITOR) 20 MG tablet Take 20 mg by mouth daily.     bisoprolol (ZEBETA) 5 MG tablet Take 1 tablet (5 mg total) by mouth 2 (two) times daily. 180 tablet 3   clobetasol cream (TEMOVATE) 0.05 % Apply 1 Application topically 2 (two) times daily.     ELIQUIS 5 MG TABS tablet TAKE 1 TABLET BY MOUTH TWICE A DAY 60 tablet 5   loratadine (CLARITIN) 10 MG tablet Take 10 mg by mouth daily as needed for allergies.     olmesartan (BENICAR) 40 MG tablet Take 20 mg by mouth in the morning and at bedtime.     omeprazole (PRILOSEC) 20 MG capsule Take 20 mg by mouth daily.     OXYGEN Place 2 L into the nose at bedtime.     No current  facility-administered medications for this visit.     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   Hyperlipidemia    Hypertension    Iron deficiency anemia 02/05/2018   Wears dentures    full upper dentures and partial lower     Past Surgical History:  Procedure Laterality Date   CARDIOVERSION N/A 05/10/2022   Procedure: CARDIOVERSION;  Surgeon: Quintella Reichert, MD;  Location: [facility] ENDOSCOPY;  Service: Cardiovascular;  Laterality: N/A;   COLONOSCOPY  05/14/2009   Gessner/ adenoma 2007   COLONOSCOPY  05/2014   Dr Marcelene Butte -[facility] - polyps   TOTAL HIP ARTHROPLASTY Right 2017   UPPER GASTROINTESTINAL ENDOSCOPY     vagiinal hysterectomy  1977   WISDOM TOOTH EXTRACTION      Social History   Socioeconomic  History   Marital status: Widowed    Spouse name: Not on file   Number of children: 1   Years of education: Not on file   Highest education level: Not on file  Occupational History   Not on file  Tobacco Use   Smoking status: Former    Current packs/day: 0.00    Types: Cigarettes    Start date: 10    Quit date: 1968    Years since quitting: 56.7   Smokeless tobacco: Never   Tobacco comments:    Former smoker 02/24/23  Vaping Use   Vaping status: Never Used  Substance and Sexual Activity   Alcohol use: Never   Drug use: Never   Sexual activity: Not Currently    Birth control/protection: Post-menopausal  Other Topics Concern   Not on file  Social History Narrative    Married   Former smoker, no alcohol no drug use   Social Determinants of Health   Financial Resource Strain: Low Risk  (03/01/2022)   [facility] to [facility]10[facility]   Overall Financial Resource Strain (CARDIA)    Difficulty of Paying Living Expenses: Not very hard  Food Insecurity: Low Risk  (02/25/2023)   [facility]   Hunger Vital Sign    Worried About Running Out of Food in the Last Year: Never true    Ran Out of Food in the Last Year: Never true  Transportation Needs: No Transportation Needs (02/25/2023)   Received from [facility]    In the past 12 months, has lack of reliable transportation kept you from medical appointments, meetings, work or from getting things needed for daily living? : No  Physical Activity: Insufficiently Active (03/01/2022)   [facility] visits prior to 09/10/2022., [facility] to 09/10/2022[facility]   Exercise Vital Sign    Days of Exercise per Week: 1 day    Minutes of Exercise per Session: 30 min  Stress: No Stress Concern Present (03/01/2022)   [facility] prior to 09/10/2022[facility] visits prior to 09/10/2022[facility]   [employer] of Occupational Health - Occupational Stress Questionnaire    Feeling of Stress : Only a little  Social Connections: Unknown (03/11/2023)   Received from [facility]   Social Network    Social Network: Not on file  Intimate Partner Violence: Unknown (03/11/2023)   Received from [facility]   HITS    Physically Hurt: Not on file    Insult or Talk Down To: Not on file    Threaten Physical Harm: Not on file    Scream or Curse: Not on file    Family History  Problem Relation  Age of Onset   Heart attack Father    Heart disease Sister    Heart disease Sister    Rectal cancer Neg Hx    Stomach cancer Neg Hx     ROS: no fevers or chills, productive cough, hemoptysis, dysphasia, odynophagia, melena, hematochezia, dysuria, hematuria, rash, seizure activity, orthopnea, PND, pedal edema, claudication. Remaining systems are negative.  Physical Exam: Well-developed well-nourished in no acute distress.  Skin is warm and dry.  HEENT is normal.  Neck is supple.  Chest is clear to auscultation with normal  expansion.  Cardiovascular exam is regular rate and rhythm.  Abdominal exam nontender or distended. No masses palpated. Extremities show no edema. neuro grossly intact   A/P  1 paroxysmal atrial fibrillation-patient is not a good candidate for amiodarone due to history of lung issues.  Previously felt not to be candidate for ablation.  She again was recently offered admission for initiation of dofetilide but declined.  Continue bisoprolol and apixaban.  2 hypertension-patient's blood pressure is controlled.  Continue present medications.   3 palpitations-symptoms are controlled.  Continue beta-blocker at present dose.  4 hyperlipidemia-continue statin.  Olga Millers, MD

## 2023-03-20 ENCOUNTER — Encounter: Payer: Self-pay | Admitting: Cardiology

## 2023-03-20 ENCOUNTER — Ambulatory Visit (INDEPENDENT_AMBULATORY_CARE_PROVIDER_SITE_OTHER): Payer: Medicare Other | Admitting: Cardiology

## 2023-03-20 VITALS — BP 142/60 | HR 60 | Ht 65.0 in | Wt 180.1 lb

## 2023-03-20 DIAGNOSIS — I10 Essential (primary) hypertension: Secondary | ICD-10-CM | POA: Diagnosis not present

## 2023-03-20 DIAGNOSIS — I480 Paroxysmal atrial fibrillation: Secondary | ICD-10-CM

## 2023-03-20 DIAGNOSIS — E7800 Pure hypercholesterolemia, unspecified: Secondary | ICD-10-CM

## 2023-03-20 DIAGNOSIS — R002 Palpitations: Secondary | ICD-10-CM | POA: Diagnosis not present

## 2023-03-20 NOTE — Patient Instructions (Signed)

## 2023-03-28 ENCOUNTER — Telehealth: Payer: Self-pay | Admitting: *Deleted

## 2023-03-28 NOTE — Telephone Encounter (Signed)
When patient was seen 03/20/23, as she was leaving she ask if she could talk to someone about her medications. She would like a call from pharmacist.

## 2023-03-28 NOTE — Telephone Encounter (Signed)
Spoke with pt. Had questions regarding Fosamax vs Reclast, pain medication, and steroid injections. Questions answered to pt's satisfaction.

## 2023-04-01 ENCOUNTER — Ambulatory Visit
Admission: EM | Admit: 2023-04-01 | Discharge: 2023-04-01 | Disposition: A | Discharge status: Home / Self Care | Payer: Medicare Other

## 2023-04-01 ENCOUNTER — Other Ambulatory Visit: Payer: Self-pay

## 2023-04-01 DIAGNOSIS — R197 Diarrhea, unspecified: Secondary | ICD-10-CM

## 2023-04-01 DIAGNOSIS — K625 Hemorrhage of anus and rectum: Secondary | ICD-10-CM

## 2023-04-01 NOTE — ED Provider Notes (Signed)
[name]    CSN: [id number] Arrival date & time: 04/01/23  0933      History   Chief Complaint Chief Complaint  Patient presents with   Blood In Stools    HPI Fabrienne Zebell is a 82 y.o. female.   HPI Very pleasant 82 year old female presents with watery diarrhea with red blood in stool streaked throughout.  Patient actually has diarrhea sample with her today.  Patient is currently on Eliquis for paroxysmal atrial fibrillation.  PMH significant for hyper coagulable state due to persistent atrial fibrillation, HTN, and obesity.  Patient does have history of adenomatous colonic polyps and constipation.  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   Hyperlipidemia    Hypertension    Iron deficiency anemia 02/05/2018   Wears dentures    full upper dentures and partial lower     Patient Active Problem List   Diagnosis Date Noted   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) 05/17/2022   PAF (paroxysmal atrial fibrillation) (HCC)    Iron deficiency anemia 02/05/2018   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/05/2018   HYPERLIPIDEMIA 08/31/2007   HYPERTENSION 08/31/2007   GERD 08/31/2007   HIATAL HERNIA 08/31/2007   CONSTIPATION 08/31/2007   PALPITATIONS 08/31/2007    Past Surgical History:  Procedure Laterality Date   CARDIOVERSION N/A 05/10/2022   Procedure: CARDIOVERSION;   Surgeon: Quintella Reichert, MD;  Location: [facility] ENDOSCOPY;  Service: Cardiovascular;  Laterality: N/A;   COLONOSCOPY  05/14/2009   Gessner/ adenoma 2007   COLONOSCOPY  05/2014   Dr Marcelene Butte -[facility] - polyps   TOTAL HIP ARTHROPLASTY Right 2017   UPPER GASTROINTESTINAL ENDOSCOPY     vagiinal hysterectomy  1977   WISDOM TOOTH EXTRACTION      OB History   No obstetric history on file.      Home Medications    Prior to Admission medications   Medication Sig Start Date End Date Taking? Authorizing Provider  acetaminophen (TYLENOL) 500 MG tablet Take 1,000-1,500 mg by mouth See admin instructions. Take 1500 mg in the morning, 1000 mg at bedtime    [provider]  amLODipine (NORVASC) 5 MG tablet Take 5 mg by mouth daily. 09/23/21   [provider]  atorvastatin (LIPITOR) 20 MG tablet Take 20 mg by mouth daily.    [provider]  bisoprolol (ZEBETA) 5 MG tablet Take 1 tablet (5 mg total) by mouth 2 (two) times daily. 06/06/22   Lewayne Bunting, MD  clobetasol cream (TEMOVATE) 0.05 % Apply 1 Application topically 2 (two) times daily. 08/25/22   [provider]  ELIQUIS 5 MG TABS tablet TAKE 1 TABLET BY MOUTH TWICE A DAY 01/10/23   Lewayne Bunting, MD  loratadine (CLARITIN) 10 MG tablet Take 10 mg by mouth daily as needed for  allergies.    [provider]  olmesartan (BENICAR) 40 MG tablet Take 20 mg by mouth in the morning and at bedtime.    [provider]  omeprazole (PRILOSEC) 20 MG capsule Take 20 mg by mouth daily. 08/14/14   [provider]  OXYGEN Place 2 L into the nose at bedtime.    [provider]    Family History Family History  Problem Relation Age of Onset   Heart attack Father    Heart disease Sister    Heart disease Sister    Rectal cancer Neg Hx    Stomach cancer Neg Hx     Social History Social History   Tobacco Use   Smoking status: Former    Current packs/day: 0.00    Types:  Cigarettes    Start date: 1958    Quit date: 1968    Years since quitting: 56.7   Smokeless tobacco: Never   Tobacco comments:    Former smoker 02/24/23  Vaping Use   Vaping status: Never Used  Substance Use Topics   Alcohol use: Never   Drug use: Never     Allergies   Codeine, Cold relief plus  [chlorphen-phenyleph-asa], Fexofenadine hcl, Ace inhibitors, Epinephrine, and Metoclopramide   Review of Systems Review of Systems  Gastrointestinal:  Positive for blood in stool and diarrhea.       Patient concerned with bloody diarrhea as she is on Eliquis for PAF.     Physical Exam Triage Vital Signs ED Triage Vitals  Encounter Vitals Group     BP 04/01/23 0945 113/69     Systolic BP Percentile --      Diastolic BP Percentile --      Pulse Rate 04/01/23 0945 61     Resp 04/01/23 0945 18     Temp 04/01/23 0945 97.8 F (36.6 C)     Temp Source 04/01/23 0945 Oral     SpO2 04/01/23 0945 96 %     Weight --      Height --      Head Circumference --      Peak Flow --      Pain Score 04/01/23 0943 0     Pain Loc --      Pain Education --      Exclude from Growth Chart --    No data found.  Updated Vital Signs BP 113/69 (BP Location: Left Arm)   Pulse 61   Temp 97.8 F (36.6 C) (Oral)   Resp 18   SpO2 96%    Physical Exam Vitals and nursing note reviewed.  Constitutional:      General: She is not in acute distress.    Appearance: Normal appearance. She is obese. She is not ill-appearing, toxic-appearing or diaphoretic.  HENT:     Head: Normocephalic and atraumatic.     Mouth/Throat:     Mouth: Mucous membranes are moist.     Pharynx: Oropharynx is clear.  Eyes:     Extraocular Movements: Extraocular movements intact.     Conjunctiva/sclera: Conjunctivae normal.     Pupils: Pupils are equal, round, and reactive to light.  Cardiovascular:     Rate and Rhythm: Normal rate and regular rhythm.     Pulses: Normal pulses.     Heart sounds: Normal heart sounds.   Pulmonary:     Effort: Pulmonary effort is normal.     Breath sounds: Normal breath sounds. No wheezing, rhonchi or rales.  Genitourinary:  Comments: Patient defers exam for possible hemorrhoids this morning Musculoskeletal:     Cervical back: Normal range of motion and neck supple.  Skin:    General: Skin is warm and dry.  Neurological:     General: No focal deficit present.     Mental Status: She is alert and oriented to person, place, and time. Mental status is at baseline.  Psychiatric:        Mood and Affect: Mood normal.        Behavior: Behavior normal.        Thought Content: Thought content normal.      UC Treatments / Results  Labs (all labs ordered are listed, but only abnormal results are displayed) Labs Reviewed - No data to display  EKG   Radiology No results found.  Procedures Procedures (including critical care time)  Medications Ordered in UC Medications - No data to display  Initial Impression / Assessment and Plan / UC Course  I have reviewed the triage vital signs and the nursing notes.  Pertinent labs & imaging results that were available during my care of the patient were reviewed by me and considered in my medical decision making (see chart for details).     MDM: 1.  Rectal bleeding-possible this can be internal/external hemorrhoid patient has history of adenomatous colonic polyps-lengthy discussion with patient this morning on need for GI follow-up if symptoms continue into early next week for possible colonoscopy to see source of bleeding; 2.  Diarrhea, unspecified type-patient presents with sample this morning unable to identify whether blood contained or not.  Patient provided stool kit prior to discharge today. Advised patient watchful waiting for now.  Advised patient if diarrhea increases please return to clinic for stool kit.  Advised if frank blood is visualized in toilet and/or with bowel movements and increasing frequency please go to  [facility] ED for further evaluation.  Encouraged to increase daily water intake (32 to 64 ounces per day 7 days/week.  Advised if symptoms worsen and/or unresolved please follow-up with PCP, GI, ED or here for further evaluation.  Patient discharged home, hemodynamically stable. Final Clinical Impressions(s) / UC Diagnoses   Final diagnoses:  Rectal bleeding  Diarrhea, unspecified type     Discharge Instructions      Advised patient watchful waiting for now.  Advised patient if diarrhea increases please return to clinic for stool kit.  Advised if frank blood is visualized in toilet and/or with bowel movements and increasing frequency please go to [facility] ED for further evaluation.  Encouraged to increase daily water intake (32 to 64 ounces per day 7 days/week.  Advised if symptoms worsen and/or unresolved please follow-up with PCP, GI, ED or here for further evaluation.     ED Prescriptions   None    PDMP not reviewed this encounter.   Trevor Iha, FNP 04/01/23 1047

## 2023-04-01 NOTE — Discharge Instructions (Addendum)
Advised patient watchful waiting for now.  Advised patient if diarrhea increases please return to clinic for stool kit.  Advised if frank blood is visualized in toilet and/or with bowel movements and increasing frequency please go to [facility] ED for further evaluation.  Encouraged to increase daily water intake (32 to 64 ounces per day 7 days/week.  Advised if symptoms worsen and/or unresolved please follow-up with PCP, GI, ED or here for further evaluation.

## 2023-04-01 NOTE — ED Triage Notes (Signed)
Pt reports having watery diarrhea and noticed red streaks throughout. Pt states she has been taking eliquis, denies abd pain and denies taking any stool softeners. Patient states she had about 8 watery stools post dinner.   Started: last night

## 2023-05-18 ENCOUNTER — Ambulatory Visit: Payer: Self-pay | Admitting: *Deleted

## 2023-05-18 NOTE — Telephone Encounter (Signed)
  Chief Complaint:  Cardiomobile indicating she is in A. Fib.   She has a history and is on Eliquis. Symptoms: Anxious and "Not feeling like myself".    "A little lightheaded"   Tired Frequency: Since early this morning Pertinent Negatives: Patient denies shortness of breath, chest pain, fluttering in chest, sweating, dizziness.    She does have her oxygen on that she normally uses at night.   Disposition: ED /[] Urgent Care (no appt availability in office) / [] Appointment(In office/virtual)/ []  [facility]/ [] Home Care/ [] Refused Recommended Disposition /[] [place] [facility]/ []  Follow-up with PCP Additional Notes: Pt called in on the community line of the Patient Engagement [facility])  concerned that her Cardiomobile device is indicating she is in A. Fib.   She didn't know what to do or which hospital she should go to.   I live in [place] [facility] [place].   Last time I went to [facility] but they didn't really do anything for me. I instructed her to call 911 because she didn't need to be driving and she lives alone.   "My neighbor might could take me".    I let her know it would be best to call 911.   She said,   "Ok" and thanked me for my help.   I also let her know they would contact her cardiologist when she got to the ED.

## 2023-05-18 NOTE — Telephone Encounter (Signed)
Reason for Disposition  [1] Dizziness, lightheadedness, or weakness AND [2] heart beating very rapidly (e.g., > 140 / minute)    HR is high for her.  88-110.   Cardiomobile indicating she is in A. Fib.   Feeling anxious and "not myself".   I'm tired too.  Answer Assessment - Initial Assessment Questions 1. DESCRIPTION: "Please describe your heart rate or heartbeat that you are having" (e.g., fast/slow, regular/irregular, skipped or extra beats, "palpitations")     I see Dr. Jens Som in [place].   Pt. Calling in on the community line.    I'm not in pain, dizzy or having symptoms.   It's 89 and I'm using my oxygen.   I took my beta blocker this morning and mag like I usually do. I went to the hospital last time this happened.   They didn't really do anything.   2. ONSET: "When did it start?" (Minutes, hours or days)      I'm feeling anxious.   I've had this before but it's been awhile.   I'm under stress and I'm tired.  I have A. Fib.  I have a cardiomobile device.   It says possible A. Fib.    No chest pain, or flutters in my chest.   No shortness of breath.   I don't know what to do.  Denies being sweaty.   I just don't feel right.   I'm not aware of my heart doing anything.    I didn't sleep well last night.   I was anxious over another issue.   I feel lightheaded and anxious.   I live alone.    3. DURATION: "How long does it last" (e.g., seconds, minutes, hours)     Early this morning it started. 4. PATTERN "Does it come and go, or has it been constant since it started?"  "Does it get worse with exertion?"   "Are you feeling it now?"     Constant since then.    I'm tired.   I don't know which hospital to go to.   Dr. Jens Som is in [place] at [facility].   I live in [place] [address]   I live in [place].  I live near [address].    5. TAP: "Using your hand, can you tap out what you are feeling on a chair or table in front of you, so that I can hear?" (Note: not all patients can do  this)       Cardiomobile says I'm in possible A. Fib.    6. HEART RATE: "Can you tell me your heart rate?" "How many beats in 15 seconds?"  (Note: not all patients can do this)       88-110 fluctuates.   7. RECURRENT SYMPTOM: "Have you ever had this before?" If Yes, ask: "When was the last time?" and "What happened that time?"      Yes  but it's been years ago. 8. CAUSE: "What do you think is causing the palpitations?"     I'm possibly in A. Fib.    I'm on Eliqus when I asked if she was on a blood thinner. 9. CARDIAC HISTORY: "Do you have any history of heart disease?" (e.g., heart attack, angina, bypass surgery, angioplasty, arrhythmia)      Yes   A. Fib.   I see Dr. Jens Som in [place]. 10. OTHER SYMPTOMS: "Do you have any other symptoms?" (e.g., dizziness, chest pain, sweating, difficulty breathing)  I just feel anxious and not like myself    Denies shortness of breath, chest pain, sweating, dizziness but did say she felt a little lightheaded but she has not eaten breakfast this morning. 11. PREGNANCY: "Is there any chance you are pregnant?" "When was your last menstrual period?"       N/A due to age  Protocols used: Heart Rate and Heartbeat Questions-A-AH

## 2023-05-29 ENCOUNTER — Ambulatory Visit (INDEPENDENT_AMBULATORY_CARE_PROVIDER_SITE_OTHER): Payer: Medicare Other | Admitting: Cardiology

## 2023-05-29 ENCOUNTER — Encounter: Payer: Self-pay | Admitting: Cardiology

## 2023-05-29 VITALS — BP 139/73 | HR 67 | Ht 65.0 in | Wt 183.4 lb

## 2023-05-29 DIAGNOSIS — E7800 Pure hypercholesterolemia, unspecified: Secondary | ICD-10-CM | POA: Diagnosis not present

## 2023-05-29 DIAGNOSIS — I10 Essential (primary) hypertension: Secondary | ICD-10-CM | POA: Diagnosis not present

## 2023-05-29 DIAGNOSIS — Z09 Encounter for follow-up examination after completed treatment for conditions other than malignant neoplasm: Secondary | ICD-10-CM

## 2023-05-29 DIAGNOSIS — R002 Palpitations: Secondary | ICD-10-CM

## 2023-05-29 DIAGNOSIS — I480 Paroxysmal atrial fibrillation: Secondary | ICD-10-CM | POA: Diagnosis not present

## 2023-05-29 NOTE — Progress Notes (Signed)
HPI: FU atrial fibrillation.  Patient previously followed at [facility].  She had an episode of atrial fibrillation and underwent cardioversion October 01, 2020 and had been treated with amiodarone. Amiodarone was discontinued by pulmonary ultimately due to history of bronchiectasis. Echocardiogram January 2022 showed normal LV function.  Monitor January 2023 showed sinus rhythm with brief runs of PAT, PACs and PVCs and 1 triplet.  Patient has contacted the office multiple times due to perceived side effects from apixaban and Xarelto. Patient was seen by Dr. Lalla Brothers for consideration of ablation and felt not to be a good candidate.  He recommended dofetilide therapy if she has recurrences.  He also felt that given supplemental oxygen use she would be at prohibitive risk for watchman implant.  Has been seen multiple times with recurrent atrial fibrillation.  She has declined dofetilide at this point.  Patient seen November 7 at [facility] with episode of atrial fibrillation that terminated in the emergency room.  Since last seen, when she is not in atrial fibrillation she denies increased dyspnea, chest pain or syncope.  Current Outpatient Medications  Medication Sig Dispense Refill   acetaminophen (TYLENOL) 500 MG tablet Take 1,000-1,500 mg by mouth See admin instructions. Take 1500 mg in the morning, 1000 mg at bedtime     amLODipine (NORVASC) 5 MG tablet Take 5 mg by mouth daily.     atorvastatin (LIPITOR) 20 MG tablet Take 20 mg by mouth daily.     bisoprolol (ZEBETA) 5 MG tablet Take 1 tablet (5 mg total) by mouth 2 (two) times daily. 180 tablet 3   clobetasol cream (TEMOVATE) 0.05 % Apply 1 Application topically 2 (two) times daily.     ELIQUIS 5 MG TABS tablet TAKE 1 TABLET BY MOUTH TWICE A DAY 60 tablet 5   loratadine (CLARITIN) 10 MG tablet Take 10 mg by mouth daily as needed for allergies.     olmesartan (BENICAR) 40 MG tablet Take 20 mg by mouth in the morning and at bedtime.      omeprazole (PRILOSEC) 20 MG capsule Take 20 mg by mouth daily.     OXYGEN Place 2 L into the nose at bedtime.     No current facility-administered medications for this visit.     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   Hyperlipidemia    Hypertension    Iron deficiency anemia 02/05/2018   Wears dentures    full upper dentures and partial lower     Past Surgical History:  Procedure Laterality Date   CARDIOVERSION N/A 05/10/2022   Procedure: CARDIOVERSION;  Surgeon: Quintella Reichert, MD;  Location: [facility] ENDOSCOPY;  Service: Cardiovascular;  Laterality: N/A;   COLONOSCOPY  05/14/2009   Gessner/ adenoma 2007   COLONOSCOPY  05/2014   Dr Marcelene Butte [facility] - polyps   TOTAL HIP ARTHROPLASTY Right 2017   UPPER  GASTROINTESTINAL ENDOSCOPY     vagiinal hysterectomy  1977   WISDOM TOOTH EXTRACTION      Social History   Socioeconomic History   Marital status: Widowed    Spouse name: Not on file   Number of children: 1   Years of education: Not on file   Highest education level: Not on file  Occupational History   Not on file  Tobacco Use   Smoking status: Former    Current packs/day: 0.00    Types: Cigarettes    Start date: 93    Quit date: 1968    Years since quitting: 56.9   Smokeless tobacco: Never   Tobacco comments:    Former smoker 02/24/23  Vaping Use   Vaping status: Never Used  Substance and Sexual Activity   Alcohol use: Never   Drug use: Never   Sexual activity:  Not Currently    Birth control/protection: Post-menopausal  Other Topics Concern   Not on file  Social History Narrative   Married   Former smoker, no alcohol no drug use   Social Determinants of Health   Financial Resource Strain: Low Risk  (03/01/2022)   [facility] from [facility] prior to 09/10/2022., [facility] visits prior to 09/10/2022., [facility]   Overall Financial Resource Strain (CARDIA)    Difficulty of Paying Living Expenses: Not very hard  Food Insecurity: Low Risk  (02/25/2023)   Received from [facility]   Hunger Vital Sign    Worried About Running Out of Food in the Last Year: Never true    Ran Out of Food in the Last Year: Never true  Transportation Needs: No Transportation Needs (02/25/2023)   Received from [facility]    In the past 12 months, has lack of reliable transportation kept you from medical appointments, meetings, work or from getting things needed for daily living? : No  Physical Activity: Insufficiently Active (03/01/2022)   [facility] from [facility] visits prior to 09/10/2022., [facility] visits prior to 09/10/2022., [facility]   Exercise Vital Sign    Days of Exercise per Week: 1 day    Minutes of Exercise per Session: 30 min  Stress: No Stress Concern Present (03/01/2022)   [facility] visits prior to 09/10/2022., [facility], [facility] visits prior to 09/10/2022., [facility]   [employer] of Occupational Health - Occupational Stress Questionnaire    Feeling of Stress : Only a little  Social Connections: Unknown (03/11/2023)   Received from [facility]   Social Network    Social Network: Not on file  Intimate Partner Violence: Unknown (03/11/2023)   Received from [facility]   HITS    Physically Hurt: Not on file    Insult or Talk Down  To: Not on file    Threaten Physical Harm: Not on file    Scream or Curse: Not on file    Family History  Problem Relation Age of Onset   Heart attack Father    Heart disease Sister    Heart disease Sister    Rectal cancer Neg Hx    Stomach cancer Neg Hx     ROS: no fevers or chills, productive cough, hemoptysis, dysphasia, odynophagia, melena, hematochezia, dysuria, hematuria, rash, seizure activity, orthopnea, PND, pedal edema, claudication. Remaining systems are negative.  Physical Exam: Well-developed well-nourished in  no acute distress.  Skin is warm and dry.  HEENT is normal.  Neck is supple.  Chest is clear to auscultation with normal expansion.  Cardiovascular exam is regular rate and rhythm.  Abdominal exam nontender or distended. No masses palpated. Extremities show no edema. neuro grossly intact  EKG Interpretation Date/Time:  Monday May 29 2023 08:10:20 EST Ventricular Rate:  67 PR Interval:  148 QRS Duration:  76 QT Interval:  416 QTC Calculation: 439 R Axis:   33  Text Interpretation: Sinus rhythm with occasional Premature ventricular complexes Nonspecific T wave abnormality When compared with ECG of 28-Feb-2023 15:04, Premature ventricular complexes are now Present Inverted T waves have replaced nonspecific T wave abnormality in Anterior leads Confirmed by Olga Millers ([id number]) on 05/29/2023 8:11:53 AM    A/P  1 paroxysmal atrial fibrillation-patient is in sinus rhythm today.  Seen recently at [facility] with episode of atrial fibrillation that converted spontaneously in the emergency room.  As outlined in previous notes she is not a good candidate for amiodarone given history of lung disease.  She has been felt not to be a good candidate for ablation.  We again discussed potential admission for initiation of dofetilide but she declined.  She will contact us if she is agreeable and we will arrange evaluation in atrial fibrillation clinic.  Will continue  bisoprolol and apixaban.  Previously felt to be a poor candidate for watchman as well.  2 hyperlipidemia-continue statin.  3 hypertension-blood pressure controlled.  Continue present medical regimen.  4 palpitations-continue beta-blocker.  Olga Millers, MD

## 2023-05-29 NOTE — Patient Instructions (Signed)
    Follow-Up: At [facility], you and your health needs are our priority.  As part of our continuing mission to provide you with exceptional heart care, we have created designated Provider Care Teams.  These Care Teams include your primary Cardiologist (physician) and Advanced Practice Providers (APPs -  Physician Assistants and Nurse Practitioners) who all work together to provide you with the care you need, when you need it.    Your next appointment:   6 month(s)  Provider:   Olga Millers, MD

## 2023-06-04 ENCOUNTER — Other Ambulatory Visit: Payer: Self-pay | Admitting: Cardiology

## 2023-06-04 DIAGNOSIS — I480 Paroxysmal atrial fibrillation: Secondary | ICD-10-CM

## 2023-06-06 ENCOUNTER — Ambulatory Visit (HOSPITAL_COMMUNITY): Payer: Medicare Other | Admitting: Physician Assistant

## 2023-06-13 ENCOUNTER — Ambulatory Visit
Admission: EM | Admit: 2023-06-13 | Discharge: 2023-06-13 | Disposition: A | Discharge status: Home / Self Care | Payer: Medicare Other | Attending: Family Medicine | Admitting: Family Medicine

## 2023-06-13 ENCOUNTER — Other Ambulatory Visit: Payer: Self-pay

## 2023-06-13 DIAGNOSIS — J029 Acute pharyngitis, unspecified: Secondary | ICD-10-CM

## 2023-06-13 LAB — POCT RAPID STREP A (OFFICE): Rapid Strep A Screen: NEGATIVE

## 2023-06-13 LAB — POC SARS CORONAVIRUS 2 AG -  ED: SARS Coronavirus 2 Ag: NEGATIVE

## 2023-06-13 LAB — POCT INFLUENZA A/B
Influenza A, POC: NEGATIVE
Influenza B, POC: NEGATIVE

## 2023-06-13 NOTE — ED Provider Notes (Signed)
[name]    CSN: [id number] Arrival date & time: 06/13/23  1204      History   Chief Complaint Chief Complaint  Patient presents with   Cough    HPI Cortlyn Hegel is a 82 y.o. female.   HPI  Patient is here for an upper respiratory infection.  She has runny nose and sore throat.  Sore throat for 4 days.  Sore throat is very painful.  No fever or chills.  No headache or bodyaches.  She called her primary care doctor.  The primary care doctor told to go to the urgent care for testing.  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   Hyperlipidemia    Hypertension    Iron deficiency anemia 02/05/2018   Wears dentures    full upper dentures and partial lower     Patient Active Problem List   Diagnosis Date Noted   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) 05/17/2022   PAF (paroxysmal atrial fibrillation) (HCC)    Iron deficiency anemia 02/05/2018   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/05/2018   HYPERLIPIDEMIA 08/31/2007   Essential hypertension 08/31/2007   GERD 08/31/2007   Diaphragmatic hernia 08/31/2007   Constipation 08/31/2007   PALPITATIONS 08/31/2007    Past Surgical History:  Procedure Laterality Date   CARDIOVERSION N/A 05/10/2022   Procedure: CARDIOVERSION;  Surgeon: Quintella Reichert, MD;  Location: [facility] ENDOSCOPY;  Service: Cardiovascular;  Laterality: N/A;   COLONOSCOPY  05/14/2009    Gessner/ adenoma 2007   COLONOSCOPY  05/2014   Dr Marcelene Butte -[facility] - polyps   TOTAL HIP ARTHROPLASTY Right 2017   UPPER GASTROINTESTINAL ENDOSCOPY     vagiinal hysterectomy  1977   WISDOM TOOTH EXTRACTION      OB History   No obstetric history on file.      Home Medications    Prior to Admission medications   Medication Sig Start Date End Date Taking? Authorizing Provider  acetaminophen (TYLENOL) 500 MG tablet Take 1,000-1,500 mg by mouth See admin instructions. Take 1500 mg in the morning, 1000 mg at bedtime    [provider]  amLODipine (NORVASC) 5 MG tablet Take 5 mg by mouth daily. 09/23/21   [provider]  atorvastatin (LIPITOR) 20 MG tablet Take 20 mg by mouth daily.    [provider]  bisoprolol (ZEBETA) 5 MG tablet TAKE 1 TABLET BY MOUTH TWICE A DAY 06/05/23   Lewayne Bunting, MD  clobetasol cream (TEMOVATE) 0.05 % Apply 1 Application topically 2 (two) times daily. 08/25/22   [provider]  ELIQUIS 5 MG TABS tablet TAKE 1 TABLET BY MOUTH TWICE A DAY 01/10/23   Lewayne Bunting, MD  olmesartan (BENICAR) 40 MG tablet Take 20 mg by mouth in the morning and at bedtime.    [provider]  omeprazole (PRILOSEC) 20 MG capsule Take 20 mg by mouth daily. 08/14/14   [provider]  OXYGEN Place 2 L into the nose at bedtime.    [provider]    Family History Family History  Problem Relation Age of Onset   Heart attack Father    Heart disease Sister    Heart disease Sister    Rectal cancer Neg Hx    Stomach cancer Neg Hx     Social History Social History   Tobacco Use   Smoking status: Former    Current packs/day: 0.00    Types: Cigarettes    Start date: 1958    Quit date: 1968    Years since quitting: 56.9   Smokeless tobacco: Never   Tobacco comments:    Former smoker 02/24/23  Vaping Use   Vaping status: Never Used  Substance Use Topics   Alcohol use: Never   Drug use: Never      Allergies   Codeine, Ace inhibitors, Cold relief plus  [chlorphen-phenyleph-asa], Epinephrine, Fexofenadine hcl, and Metoclopramide   Review of Systems Review of Systems See hpi  Physical Exam Triage Vital Signs ED Triage Vitals  Encounter Vitals Group     BP 06/13/23 1211 (!) 111/59     Systolic BP Percentile --      Diastolic BP Percentile --      Pulse Rate 06/13/23 1211 61     Resp 06/13/23 1211 16     Temp 06/13/23 1211 97.9 F (36.6 C)     Temp src --      SpO2 06/13/23 1211 97 %     Weight --      Height --      Head Circumference --      Peak Flow --      Pain Score 06/13/23 1216 0     Pain Loc --      Pain Education --      Exclude from Growth Chart --    No data found.  Updated Vital Signs BP (!) 111/59   Pulse 61   Temp 97.9 F (36.6 C)   Resp 16   SpO2 97%       Physical Exam Constitutional:      General: She is not in acute distress.    Appearance: She is well-developed.  HENT:     Head: Normocephalic and atraumatic.     Right Ear: Tympanic membrane normal.     Left Ear: Tympanic membrane and ear canal normal.     Nose: Nose normal. No congestion.     Mouth/Throat:     Mouth: Mucous membranes are moist.     Pharynx: Posterior oropharyngeal erythema present.     Comments: Posterior pharynx is injected.  Tonsils surgically absent.  No exudate.  No adenopathy Eyes:     Conjunctiva/sclera: Conjunctivae normal.     Pupils: Pupils are equal, round, and reactive to light.  Cardiovascular:     Rate and Rhythm: Normal rate.     Heart sounds: Normal heart sounds.  Pulmonary:     Effort: Pulmonary effort is normal. No respiratory distress.     Breath sounds: Normal breath sounds.  Abdominal:     General: There is no distension.     Palpations: Abdomen is soft.  Musculoskeletal:        General: Normal range of motion.     Cervical back: Normal range of motion.  Skin:    General:  Skin is warm and dry.  Neurological:     Mental Status:  She is alert.      UC Treatments / Results  Labs (all labs ordered are listed, but only abnormal results are displayed) Labs Reviewed  CULTURE, GROUP A STREP [facility])  POCT INFLUENZA A/B  POCT RAPID STREP A (OFFICE)  POC SARS CORONAVIRUS 2 AG -  ED    EKG   Radiology No results found.  Procedures Procedures (including critical care time)  Medications Ordered in UC Medications - No data to display  Initial Impression / Assessment and Plan / UC Course  I have reviewed the triage vital signs and the nursing notes.  Pertinent labs & imaging results that were available during my care of the patient were reviewed by me and considered in my medical decision making (see chart for details).     Testing is negative.  Viral infection discussed. Final Clinical Impressions(s) / UC Diagnoses   Final diagnoses:  Viral pharyngitis     Discharge Instructions      Drink lots of water May take Tylenol for pain May try Chloraseptic spray or lozenge See your doctor on Thursday     ED Prescriptions   None    PDMP not reviewed this encounter.   Eustace Moore, MD 06/13/23 1346

## 2023-06-13 NOTE — Discharge Instructions (Signed)
Drink lots of water May take Tylenol for pain May try Chloraseptic spray or lozenge See your doctor on Thursday

## 2023-06-13 NOTE — ED Triage Notes (Addendum)
Sore throat and cough since Saturday. No fever. Has not taken any otc medications. Wants testing for flu, covid, strep.

## 2023-06-23 ENCOUNTER — Other Ambulatory Visit: Payer: Self-pay | Admitting: Cardiology

## 2023-06-23 DIAGNOSIS — I480 Paroxysmal atrial fibrillation: Secondary | ICD-10-CM

## 2023-06-23 NOTE — Telephone Encounter (Signed)
Prescription refill request for Eliquis received. Indication: Afib  Last office visit: 05/29/23 ([place])  Scr: 1.05 (06/15/23)  Age: 81 Weight: 83.2kg  Appropriate dose. Refill sent.

## 2023-06-24 ENCOUNTER — Encounter: Payer: Self-pay | Admitting: Cardiology

## 2023-06-26 ENCOUNTER — Other Ambulatory Visit: Payer: Self-pay

## 2023-06-26 DIAGNOSIS — I480 Paroxysmal atrial fibrillation: Secondary | ICD-10-CM

## 2023-06-26 MED ORDER — APIXABAN 5 MG PO TABS: 5.0000 mg | ORAL_TABLET | Freq: Two times a day (BID) | ORAL | 5 refills | Status: DC

## 2023-06-26 NOTE — Telephone Encounter (Signed)
Forward for to Anticoag,pool to completed  request

## 2023-06-26 NOTE — Telephone Encounter (Signed)
Prescription refill request for Eliquis received. Indication:AFIB Last office visit:12/24 Scr:1.05  12/24 Age: 82 Weight:83.2  KG  PRESCRIPTION REFILLED

## 2023-06-28 ENCOUNTER — Ambulatory Visit (HOSPITAL_COMMUNITY): Payer: Medicare Other | Admitting: Physician Assistant

## 2023-06-28 ENCOUNTER — Ambulatory Visit (HOSPITAL_COMMUNITY)
Admission: RE | Admit: 2023-06-28 | Discharge: 2023-06-28 | Disposition: A | Discharge status: Home / Self Care | Payer: Medicare Other | Source: Ambulatory Visit | Attending: Physician Assistant | Admitting: Physician Assistant

## 2023-06-28 ENCOUNTER — Encounter (HOSPITAL_COMMUNITY): Payer: Self-pay | Admitting: Physician Assistant

## 2023-06-28 VITALS — BP 120/70 | HR 62 | Ht 65.0 in | Wt 176.8 lb

## 2023-06-28 DIAGNOSIS — I10 Essential (primary) hypertension: Secondary | ICD-10-CM | POA: Insufficient documentation

## 2023-06-28 DIAGNOSIS — E119 Type 2 diabetes mellitus without complications: Secondary | ICD-10-CM | POA: Insufficient documentation

## 2023-06-28 DIAGNOSIS — D6869 Other thrombophilia: Secondary | ICD-10-CM | POA: Insufficient documentation

## 2023-06-28 DIAGNOSIS — Z7901 Long term (current) use of anticoagulants: Secondary | ICD-10-CM | POA: Diagnosis not present

## 2023-06-28 DIAGNOSIS — R9431 Abnormal electrocardiogram [ECG] [EKG]: Secondary | ICD-10-CM | POA: Insufficient documentation

## 2023-06-28 DIAGNOSIS — E785 Hyperlipidemia, unspecified: Secondary | ICD-10-CM | POA: Diagnosis present

## 2023-06-28 DIAGNOSIS — I4819 Other persistent atrial fibrillation: Secondary | ICD-10-CM | POA: Diagnosis not present

## 2023-06-28 NOTE — Progress Notes (Signed)
Primary Care Physician: Leola Brazil, DO Primary Cardiologist: Dr Jens Som Primary Electrophysiologist: Dr Lalla Brothers Referring Physician: Dr Velia Meyer is a 82 y.o. female with a history of HTN, HLD, DM, atrial fibrillation who presents for follow up in the [facility]. She had an episode of atrial fibrillation and underwent cardioversion October 01, 2020 and has been treated with amiodarone.  Amiodarone was discontinued by pulmonary ultimately due to history of bronchiectasis. Monitor January 2023 showed sinus rhythm with brief runs of PAT, PACs and PVCs and 1 triplet. Patient seen in the [facility] emergency room March 22 with complaints of palpitations and noted to have PVCs.  Patient is on Eliquis for a CHADS2VASC score of 7. She was seen by Dr Jens Som 05/09/22 and was found to be back in afib. She had repeat DCCV on 05/10/22.   Patient seen in consult by Dr Lalla Brothers on 06/08/22, not felt to be a candidate for ablation or Watchman due to chronic supplemental O2 use. She was seen at the ED at [facility] with afib with RVR. She converted to SR after a dose of Lopressor.   Patient reports that she went into afib on 8/13 with symptoms of fatigue. She was seen by Dr Flora Lipps 8/14 and ECG confirmed she was back in afib. She was sent up for DCCV but spontaneously converted.  On follow up today, patient has had two episodes of afib, one in November and one just last evening. The episodes lasted for several hours before spontaneously converting. She admits she has been very stressed with getting her health insurance changed.   Today, she denies symptoms of palpitations, chest pain, shortness of breath, orthopnea, PND, lower extremity edema, dizziness, presyncope, syncope, snoring, daytime somnolence, bleeding, or neurologic sequela. The patient is tolerating medications without difficulties and is otherwise without complaint today.    Atrial Fibrillation  Risk Factors:  she does not have symptoms or diagnosis of sleep apnea. she does not have a history of rheumatic fever.   Atrial Fibrillation Management history:  Previous antiarrhythmic drugs: amiodarone  Previous cardioversions: 10/01/20, 05/10/22 Previous ablations: none Anticoagulation history: Eliquis   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  Physical Exam: Vitals:   06/28/23 1457  BP: 120/70  Pulse: 62  Weight: 80.2 kg  Height: 5\' 5"  (1.651 m)    GEN: Well nourished, well developed in no acute distress NECK: No JVD; No carotid bruits CARDIAC: Regular rate and rhythm, no murmurs, rubs, gallops RESPIRATORY:  Clear to auscultation without rales, wheezing or rhonchi  ABDOMEN: Soft, non-tender, non-distended EXTREMITIES:  No edema; No deformity  Wt Readings from Last 3 Encounters:  06/28/23 80.2 kg  05/29/23 83.2 kg  03/20/23 81.7 kg    EKG today demonstrates  SR, PAC Vent. rate 62 BPM PR interval 150 ms QRS duration 76 ms QT/QTcB 404/410 ms   Epic records are reviewed at length today  CHA2DS2-VASc Score = 8  The patient's score is based upon: CHF History: 0 HTN History: 1 Diabetes History: 1 Stroke History: 2 (TIA) Vascular Disease History: 1 Age Score:  2 Gender Score: 1        ASSESSMENT AND PLAN: Persistent Atrial Fibrillation (ICD10:  I48.19) The patient's CHA2DS2-VASc score is 8, indicating a 10.8% annual risk of stroke.   Amiodarone discontinued due to lung issues.  Patient previously declined dofetilide admission Patient back in SR. Patient would like to continue to monitor her afib burden on her Kardia mobile. If her burden increases, she may be willing to consider dofetilide.  Continue Eliquis 5 mg BID Continue bisoprolol 10 mg daily  Secondary Hypercoagulable State (ICD10:  D68.69) The patient is at significant risk for stroke/thromboembolism based upon her CHA2DS2-VASc Score of 8.  Continue Apixaban (Eliquis). Not felt to be a candidate for Watchman due to supplemental O2 use.   HTN Stable on current regimen   Follow up in the AF clinic in 6 months.     Jorja Loa PA-C Afib Clinic [facility] [address] [place], [place] [address] [phone] 06/28/2023 3:17 PM

## 2023-07-02 ENCOUNTER — Encounter: Payer: Self-pay | Admitting: Cardiology

## 2023-07-03 NOTE — Telephone Encounter (Signed)
Patient identification verified by 2 forms. Marilynn Rail, RN    Called and spoke to patient  Patient states:   -Concerned about test result   -would like to know if EKG from OV showed Afib   -to her knowledge she has not been in an Afib  Informed patient per 12/18 OV note documentation states " Patient back in SR"  Patient aware of 12/27/23 follow up  Patient has no further questions at this time

## 2023-07-04 ENCOUNTER — Encounter: Payer: Self-pay | Admitting: Cardiology

## 2023-07-06 NOTE — Telephone Encounter (Signed)
Patient identification verified by 2 forms. Marilynn Rail, RN    Received call from patient  Patient states:   -she takes amlodipine 2.5mg  twice a day   -has been taking it this way since cardioversion   -primary care provider manages BP medications   -she is having a hard time cutting amlodipine in half properly   -recent pick up from pharmacy is oval shaped and she is unable to cut it   -previous mychart message continued BP for the past 2 weeks   -she does not check BP daily, only when she is not feeling well  Advised patient to contact PCP regarding BP medications  Patient agrees, no questions at this time

## 2023-07-06 NOTE — Telephone Encounter (Signed)
 Attempted to call patient, no answer left message requesting a call back.

## 2023-09-03 IMAGING — DX DG HIP (WITH OR WITHOUT PELVIS) 2-3V*L*
4 series · 4 of 4 positions shown · non-contrast
Comparison: CT abdomen pelvis dated December 30, 2020.

CLINICAL DATA: Left hip pain after fall.

EXAM:
DG HIP (WITH OR WITHOUT PELVIS) 2-3V LEFT

[pelvis ap (1 of 2)]
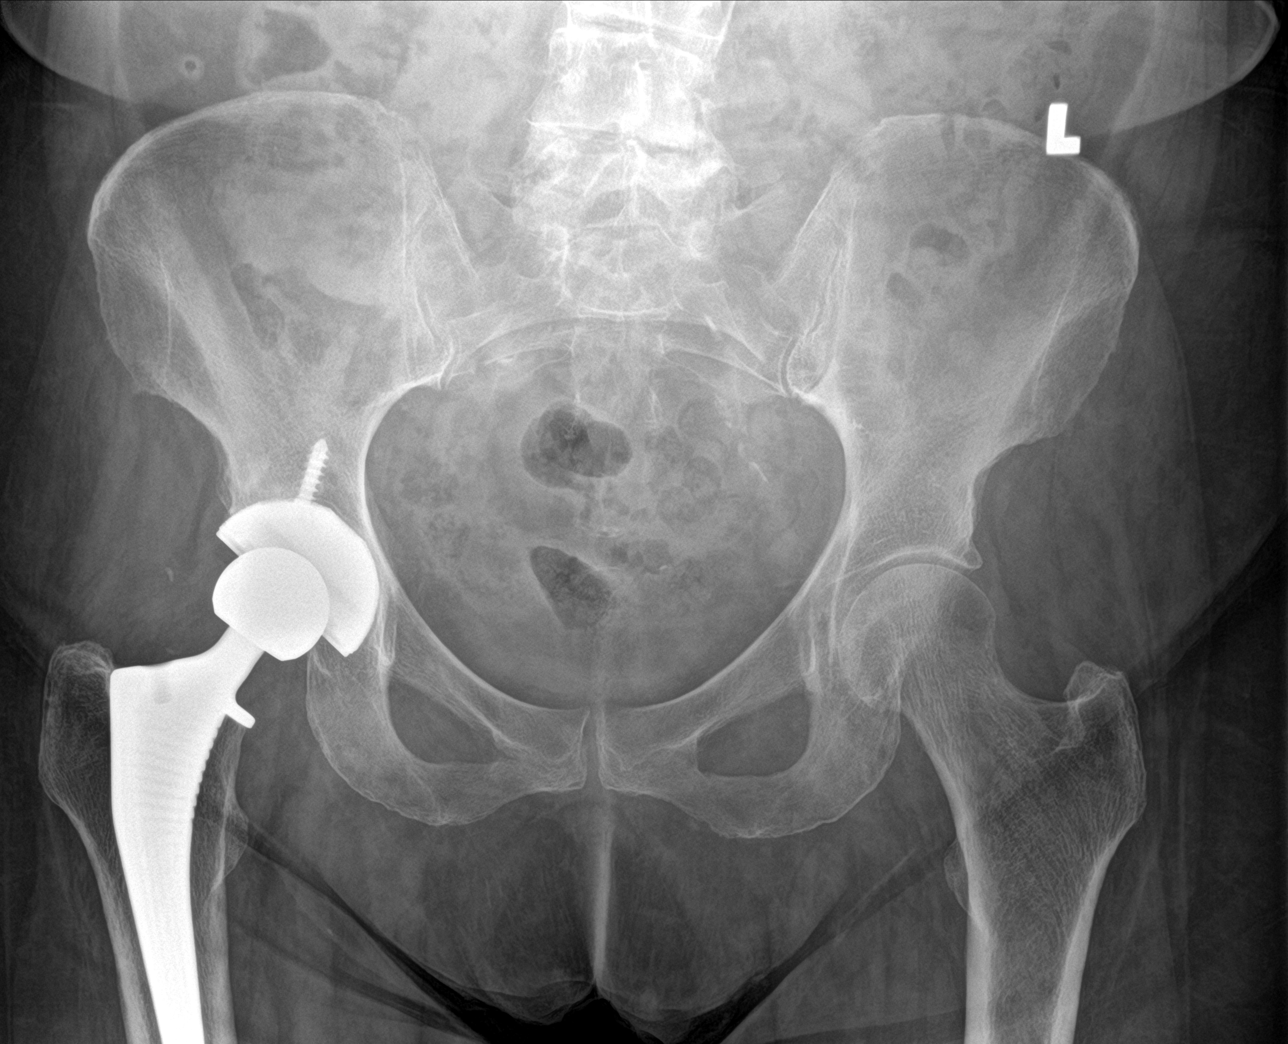

[hip lat]
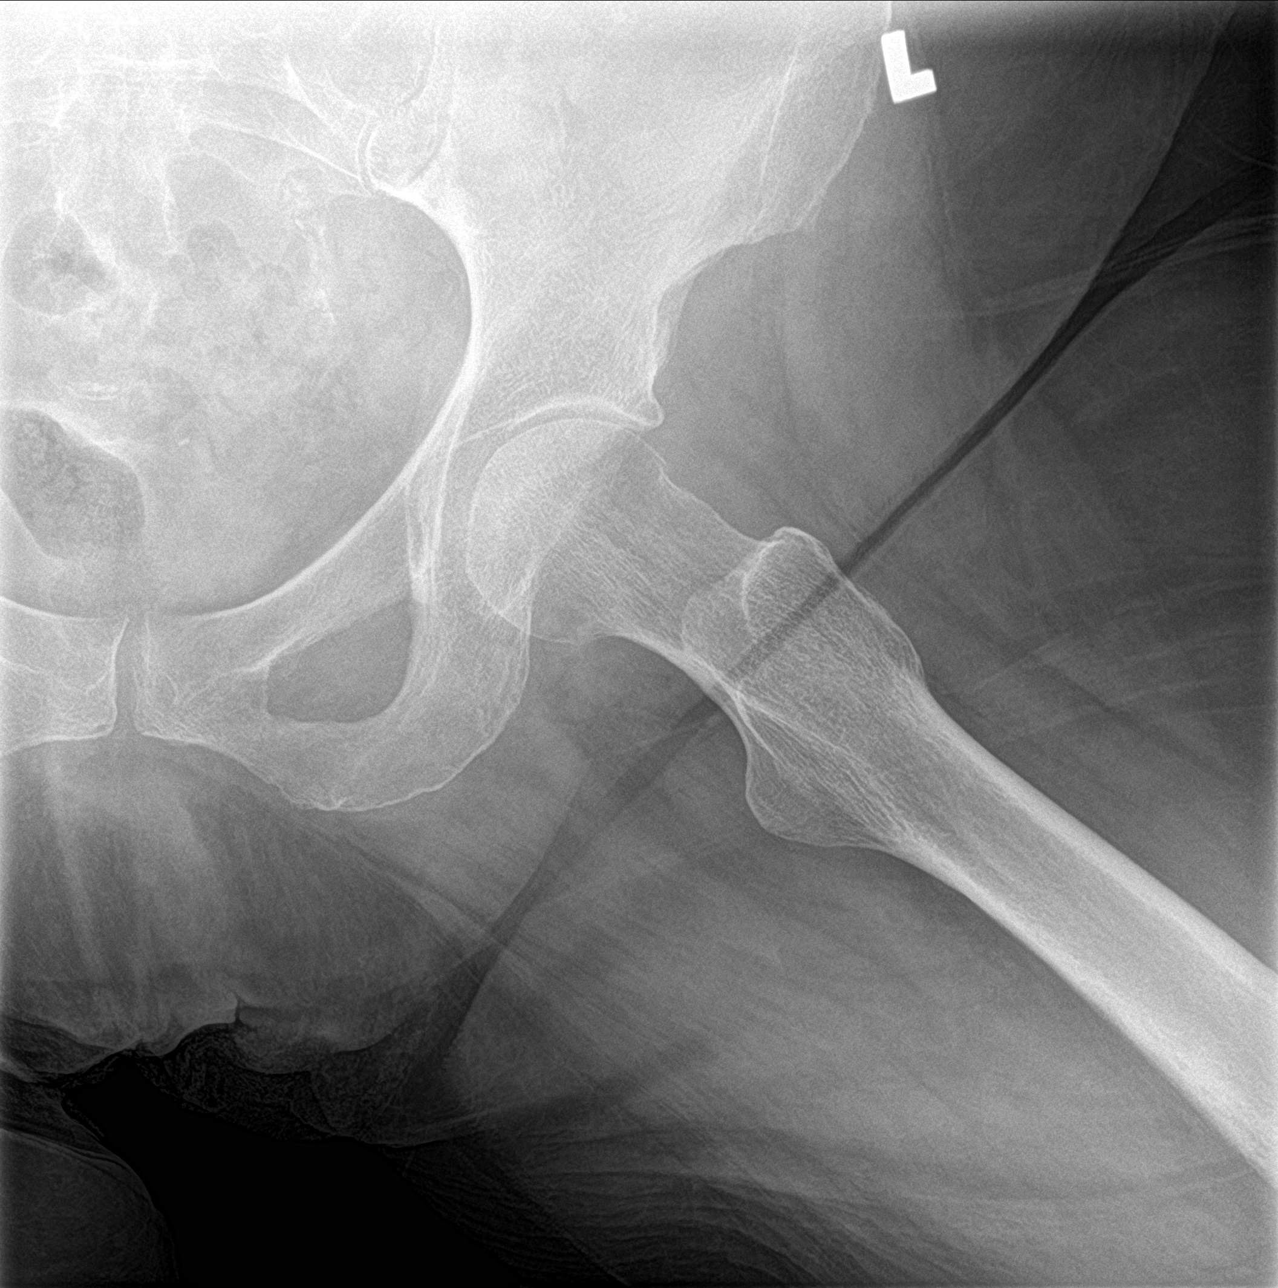

[pelvis ap (2 of 2)]
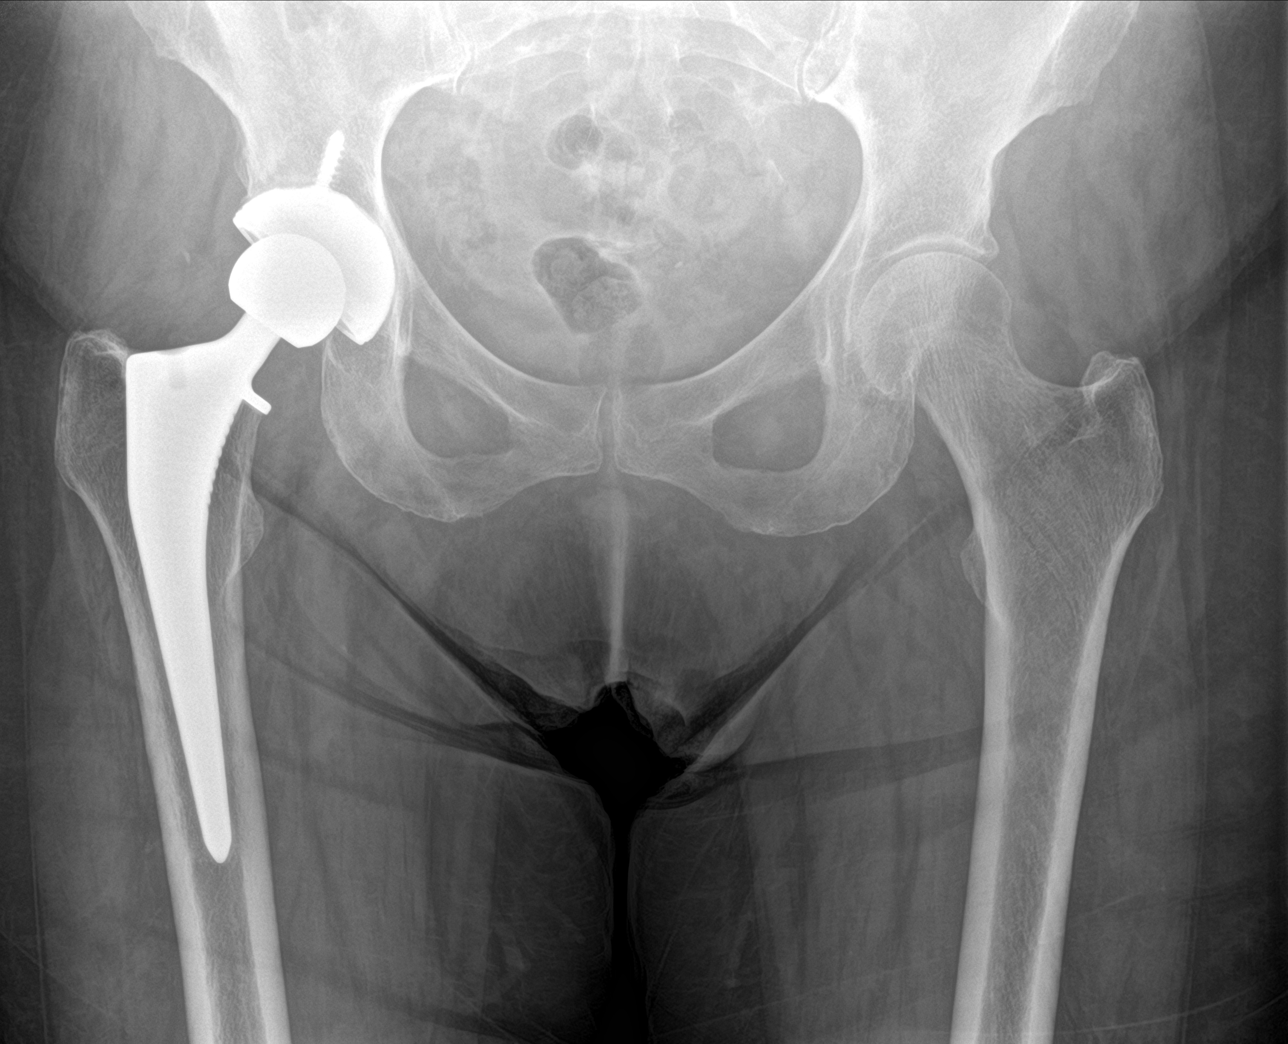

[hip ap]
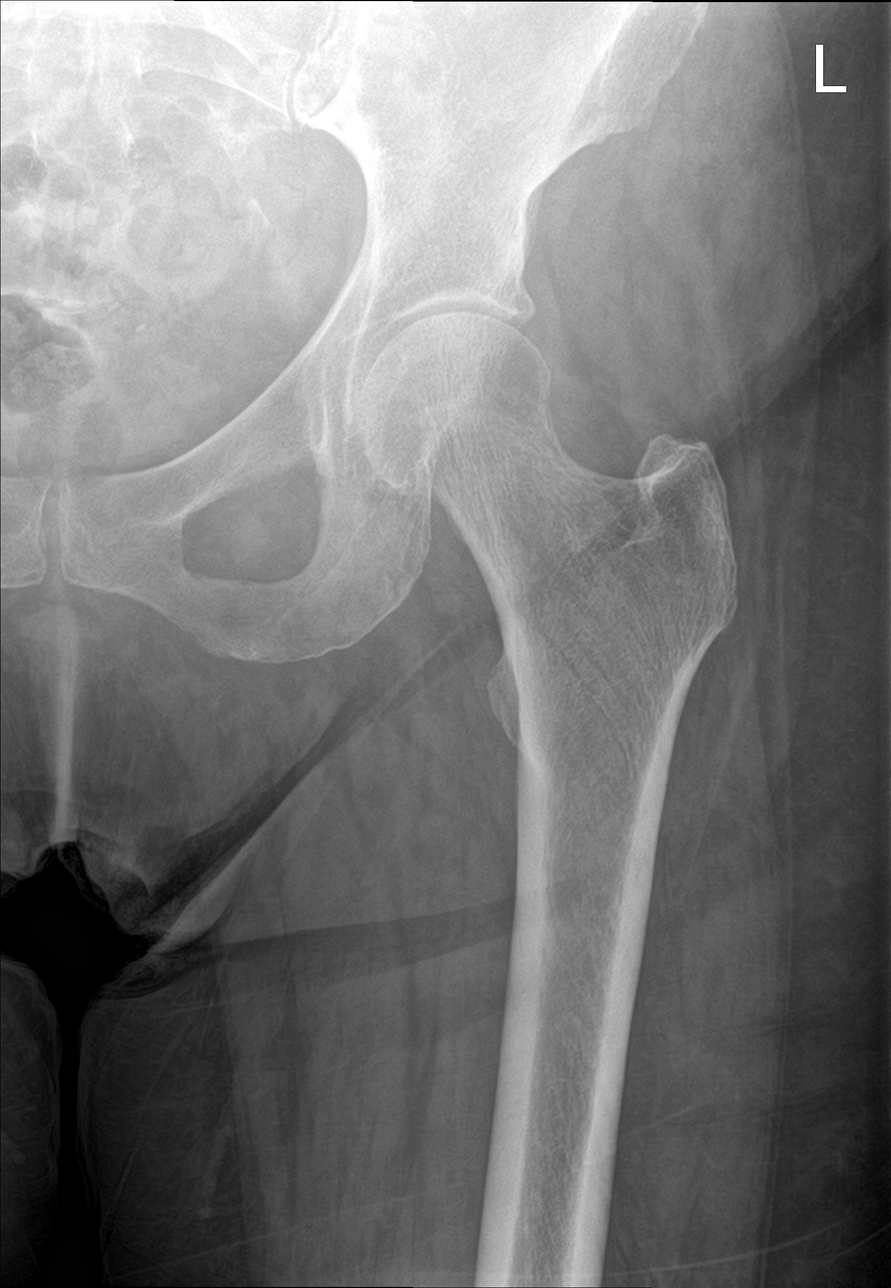

[4 of 4 positions shown; findings below may reference images not displayed]

FINDINGS: No acute fracture or dislocation. The left hip joint space is
relatively preserved. Prior right total hip arthroplasty. No
evidence of hardware failure or loosening. Soft tissues are
unremarkable.
IMPRESSION: 1. No acute osseous abnormality.

## 2023-09-03 IMAGING — DX DG HUMERUS 2V *L*
2 series · 2 of 2 positions shown · non-contrast
Comparison: Left shoulder x-rays dated February 16, 2017.

CLINICAL DATA: Left arm pain after fall.

EXAM:
LEFT HUMERUS - 2+ VIEW

[humerus ap]
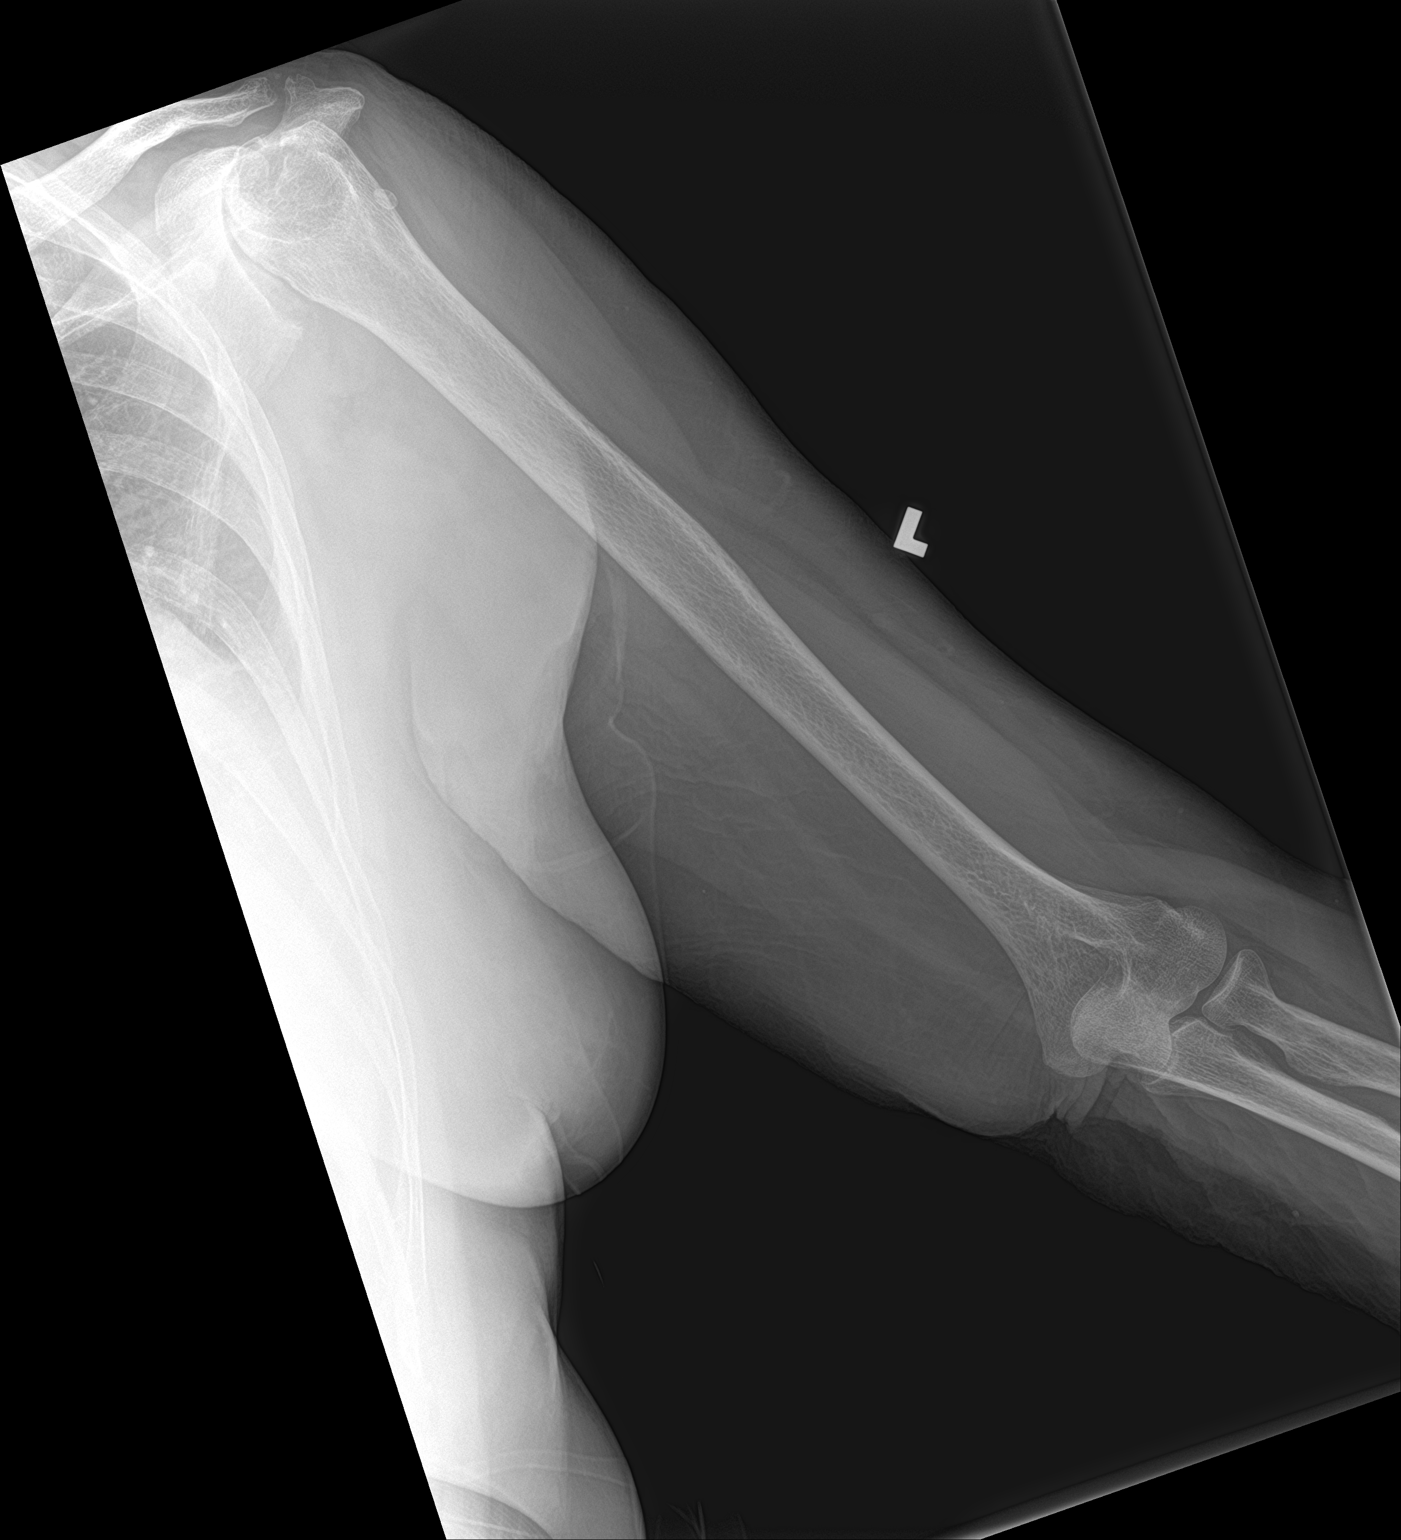

[humerus lat]
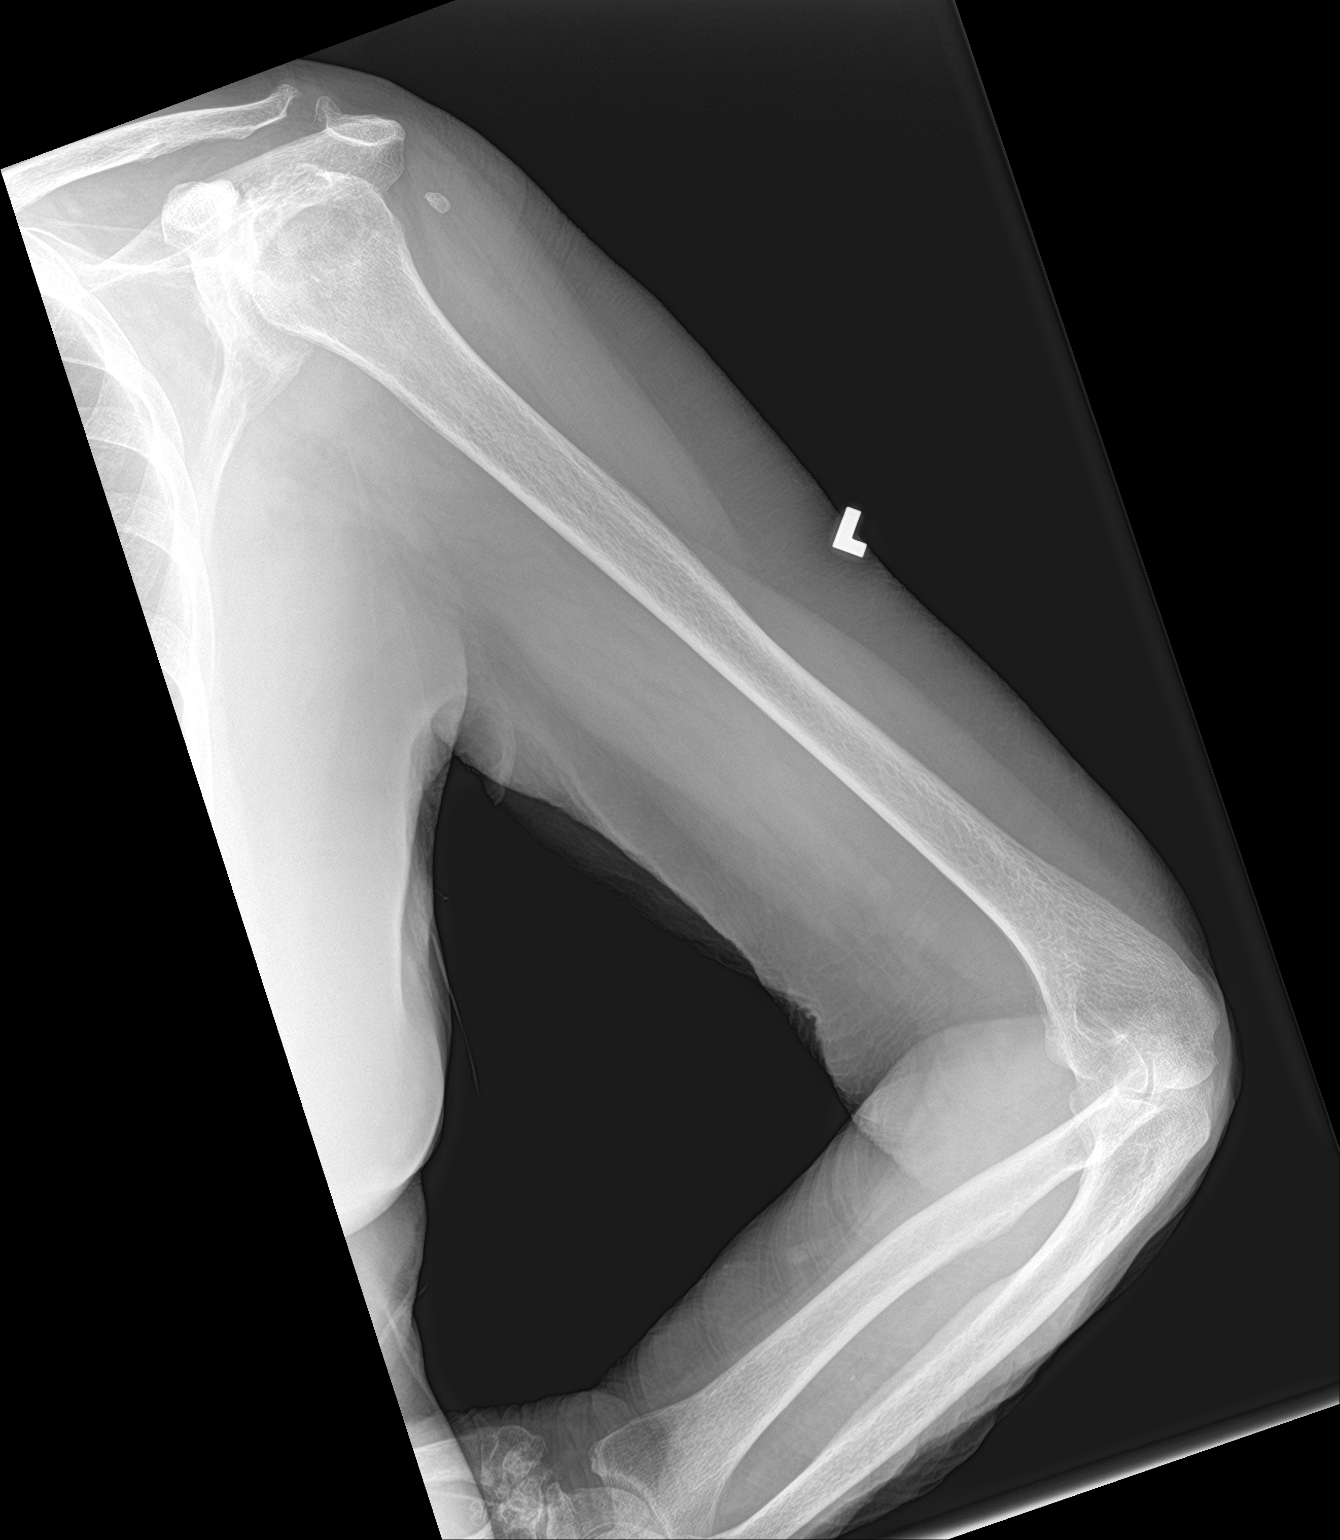

[2 of 2 positions shown; findings below may reference images not displayed]

FINDINGS: No acute fracture or dislocation. Progressive severe left
glenohumeral osteoarthritis with remodeling and flattening of the
humeral head and glenoid. Soft tissues are unremarkable.
IMPRESSION: 1. No acute osseous abnormality.
2. Progressive severe left glenohumeral osteoarthritis.

## 2023-12-16 ENCOUNTER — Other Ambulatory Visit: Payer: Self-pay | Admitting: Cardiology

## 2023-12-23 ENCOUNTER — Ambulatory Visit
Admission: EM | Admit: 2023-12-23 | Discharge: 2023-12-23 | Disposition: A | Discharge status: Home / Self Care | Attending: Family Medicine | Admitting: Family Medicine

## 2023-12-23 ENCOUNTER — Other Ambulatory Visit: Payer: Self-pay

## 2023-12-23 DIAGNOSIS — R059 Cough, unspecified: Secondary | ICD-10-CM

## 2023-12-23 DIAGNOSIS — J069 Acute upper respiratory infection, unspecified: Secondary | ICD-10-CM

## 2023-12-23 MED ORDER — PREDNISONE 20 MG PO TABS: ORAL_TABLET | ORAL | 0 refills | Status: DC

## 2023-12-23 MED ORDER — AMOXICILLIN-POT CLAVULANATE 875-125 MG PO TABS: 1.0000 | ORAL_TABLET | Freq: Two times a day (BID) | ORAL | 0 refills | Status: DC

## 2023-12-23 MED ORDER — HYDROCODONE BIT-HOMATROP MBR 5-1.5 MG/5ML PO SOLN
5.0000 mL | Freq: Four times a day (QID) | ORAL | 0 refills | Status: DC | PRN
Start: 2023-12-23 — End: 2024-01-19

## 2023-12-23 NOTE — ED Provider Notes (Signed)
 [name] [name]    CSN: [id number] Arrival date & time: 12/23/23  0916      History   Chief Complaint Chief Complaint  Patient presents with   Cough    HPI [name] [name] is a 83 y.o. female.   HPI Very pleasant 83 year old female presents with cough and wheezing for 3 days.  PMH significant for atrial fibrillation, bronchiectasis, and HTN.  Patient is currently on apixaban  and denies any unusual bleeding.  Patient reports cough has disrupted her sleep for the past 3 nights.  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   Hyperlipidemia    Hypertension    Iron deficiency anemia 02/05/2018   Wears dentures    full upper dentures and partial lower     Patient Active Problem List   Diagnosis Date Noted   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) 05/17/2022   PAF (paroxysmal atrial fibrillation) (HCC)    Iron deficiency anemia 02/05/2018   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/05/2018   HYPERLIPIDEMIA 08/31/2007   Essential hypertension 08/31/2007   GERD 08/31/2007   Diaphragmatic hernia 08/31/2007   Constipation 08/31/2007   PALPITATIONS 08/31/2007    Past Surgical History:  Procedure Laterality Date   CARDIOVERSION N/A 05/10/2022   Procedure: CARDIOVERSION;  Surgeon: Jacqueline Matsu, MD;  Location: [facility] ENDOSCOPY;  Service: Cardiovascular;  Laterality: N/A;   COLONOSCOPY  05/14/2009   Gessner/ adenoma 2007   COLONOSCOPY  05/2014   Dr Vincenza Greener -[facility]  - polyps   TOTAL HIP ARTHROPLASTY Right 2017   UPPER GASTROINTESTINAL ENDOSCOPY     vagiinal hysterectomy  1977   WISDOM TOOTH EXTRACTION      OB History   No obstetric history on file.      Home Medications    Prior to Admission medications   Medication Sig Start Date End Date Taking? Authorizing Provider  amoxicillin-clavulanate (AUGMENTIN) 875-125 MG tablet Take 1 tablet by mouth every 12 (twelve) hours. 12/23/23  Yes Leonides Ramp, FNP  HYDROcodone bit-homatropine (HYCODAN) 5-1.5 MG/5ML syrup Take 5 mLs by mouth every 6 (six) hours as needed for cough. 12/23/23  Yes Leonides Ramp, FNP  predniSONE (DELTASONE) 20 MG tablet Take 3 tabs PO daily x 5 days. 12/23/23  Yes Leonides Ramp, FNP  acetaminophen (TYLENOL) 500 MG tablet Take 1,000-1,500 mg by mouth See admin instructions. Take 1500 mg in the morning, 1000 mg at bedtime    [provider]  alendronate (FOSAMAX) 70 MG tablet Take 70 mg by mouth once a week.    [provider]  amLODipine  (NORVASC ) 5 MG tablet TAKE 1 TABLET BY MOUTH TWICE A DAY 12/18/23   Lenise Quince, MD  apixaban  (ELIQUIS ) 5 MG TABS tablet Take 1 tablet (5 mg total) by mouth 2 (two) times daily. 06/26/23   Lenise Quince, MD  atorvastatin (LIPITOR) 20 MG tablet Take 20 mg by  mouth daily.    [provider]  bisoprolol  (ZEBETA ) 5 MG tablet TAKE 1 TABLET BY MOUTH TWICE A DAY 06/05/23   Lenise Quince, MD  clobetasol cream (TEMOVATE) 0.05 % Apply 1 Application topically 2 (two) times daily. 08/25/22   [provider]  Magnesium Chloride 64 MG TBEC Take 1 tablet by mouth daily. 05/23/23   [provider]  olmesartan (BENICAR) 40 MG tablet Take 20 mg by mouth in the morning and at bedtime.    [provider]  omeprazole (PRILOSEC) 20 MG capsule Take 20 mg by mouth daily. 08/14/14   [provider]  OXYGEN Place 2 L into the nose at bedtime.    [provider]    Family History Family  History  Problem Relation Age of Onset   Heart attack Father    Heart disease Sister    Heart disease Sister    Rectal cancer Neg Hx    Stomach cancer Neg Hx     Social History Social History   Tobacco Use   Smoking status: Former    Current packs/day: 0.00    Types: Cigarettes    Start date: 1958    Quit date: 1968    Years since quitting: 57.4   Smokeless tobacco: Never   Tobacco comments:    Former smoker 02/24/23  Vaping Use   Vaping status: Never Used  Substance Use Topics   Alcohol use: Never   Drug use: Never     Allergies   Codeine, Ace inhibitors, Cold relief plus  [chlorphen-phenyleph-asa], Epinephrine, Fexofenadine hcl, and Metoclopramide   Review of Systems Review of Systems  HENT:  Positive for congestion.   Respiratory:  Positive for cough and wheezing.   All other systems reviewed and are negative.    Physical Exam Triage Vital Signs ED Triage Vitals  Encounter Vitals Group     BP      Girls Systolic BP Percentile      Girls Diastolic BP Percentile      Boys Systolic BP Percentile      Boys Diastolic BP Percentile      Pulse      Resp      Temp      Temp src      SpO2      Weight      Height      Head Circumference      Peak Flow      Pain Score      Pain Loc      Pain Education      Exclude from Growth Chart    No data found.  Updated Vital Signs BP (!) 100/58 (BP Location: Right Arm)   Pulse 73   Temp 98.2 F (36.8 C) (Oral)   Resp 18   SpO2 95%       Physical Exam Vitals and nursing note reviewed.  Constitutional:      Appearance: Normal appearance. She is obese. She is ill-appearing.  HENT:     Head: Normocephalic and atraumatic.     Right Ear: Tympanic membrane, ear canal and external ear normal.     Left Ear: Tympanic membrane, ear canal and external ear normal.     Mouth/Throat:     Mouth: Mucous membranes are moist.     Pharynx: Oropharynx is clear.   Eyes:     Extraocular Movements: Extraocular movements  intact.     Conjunctiva/sclera: Conjunctivae normal.     Pupils:  Pupils are equal, round, and reactive to light.    Cardiovascular:     Rate and Rhythm: Normal rate and regular rhythm.     Pulses: Normal pulses.     Heart sounds: Normal heart sounds.  Pulmonary:     Effort: Pulmonary effort is normal.     Breath sounds: Normal breath sounds. No wheezing, rhonchi or rales.     Comments: Infrequent nonproductive cough on exam  Musculoskeletal:        General: Normal range of motion.   Skin:    General: Skin is warm and dry.   Neurological:     General: No focal deficit present.     Mental Status: She is alert and oriented to person, place, and time.   Psychiatric:        Mood and Affect: Mood normal.        Behavior: Behavior normal.      UC Treatments / Results  Labs (all labs ordered are listed, but only abnormal results are displayed) Labs Reviewed - No data to display  EKG   Radiology No results found.  Procedures Procedures (including critical [name] time)  Medications Ordered in UC Medications - No data to display  Initial Impression / Assessment and Plan / UC Course  I have reviewed the triage vital signs and the nursing notes.  Pertinent labs & imaging results that were available during my [name] of the patient were reviewed by me and considered in my medical decision making (see chart for details).     MDM: 1.  Acute URI-Rx'd Augmentin 875/125 mg tablet: Take 1 tablet twice daily x 7 days; 2.  Cough, unspecified type-Rx'd prednisone 20 mg tablet: Take 3 tablets p.o. daily x 5 days, Rx'd Hycodan 5-1.5 mg / 5 mL syrup: Take 5 mL every 6 hours, as needed for cough. Advised patient to take medications as directed with food to completion.  Advised take prednisone with first dose of Augmentin for the next 5 of 7 days.  Advised may take Hycodan cough syrup at night prior to sleep for cough due to sedative effects.  Encouraged to increase daily water intake to 64  ounces per day while taking these medications.  Advised if symptoms worsen and/or unresolved please follow-up with your PCP or here for further evaluation.  Patient discharged home, hemodynamically stable. Final Clinical Impressions(s) / UC Diagnoses   Final diagnoses:  Cough, unspecified type  Acute URI     Discharge Instructions      Advised patient to take medications as directed with food to completion.  Advised take prednisone with first dose of Augmentin for the next 5 of 7 days.  Advised may take Hycodan cough syrup at night prior to sleep for cough due to sedative effects.  Encouraged to increase daily water intake to 64 ounces per day while taking these medications.  Advised if symptoms worsen and/or unresolved please follow-up with your PCP or here for further evaluation.     ED Prescriptions     Medication Sig Dispense Auth. Provider   amoxicillin-clavulanate (AUGMENTIN) 875-125 MG tablet Take 1 tablet by mouth every 12 (twelve) hours. 14 tablet Masiya Claassen, FNP   predniSONE (DELTASONE) 20 MG tablet Take 3 tabs PO daily x 5 days. 15 tablet Jimmie Dattilio, FNP   HYDROcodone bit-homatropine (HYCODAN) 5-1.5 MG/5ML syrup Take 5 mLs by mouth every 6 (six) hours as needed for cough. 120 mL Leonides Ramp, FNP      I have reviewed the PDMP  during this encounter.   Leonides Ramp, FNP 12/23/23 1049

## 2023-12-23 NOTE — ED Triage Notes (Signed)
 Pt c/o cough x 3 days. Some wheezing. Denies fever. Says she's been having trouble sleeping due to cough. Cough drops prn.

## 2023-12-23 NOTE — Discharge Instructions (Addendum)
 Advised patient to take medications as directed with food to completion.  Advised take prednisone with first dose of Augmentin for the next 5 of 7 days.  Advised may take Hycodan cough syrup at night prior to sleep for cough due to sedative effects.  Encouraged to increase daily water intake to 64 ounces per day while taking these medications.  Advised if symptoms worsen and/or unresolved please follow-up with your PCP or here for further evaluation.

## 2023-12-27 ENCOUNTER — Ambulatory Visit (HOSPITAL_COMMUNITY): Payer: Medicare Other | Admitting: Physician Assistant

## 2024-01-08 ENCOUNTER — Encounter: Payer: Self-pay | Admitting: Cardiology

## 2024-01-08 ENCOUNTER — Ambulatory Visit (INDEPENDENT_AMBULATORY_CARE_PROVIDER_SITE_OTHER): Admitting: Cardiology

## 2024-01-08 VITALS — BP 138/32 | HR 81 | Ht 65.0 in | Wt 180.0 lb

## 2024-01-08 DIAGNOSIS — E7800 Pure hypercholesterolemia, unspecified: Secondary | ICD-10-CM | POA: Diagnosis not present

## 2024-01-08 DIAGNOSIS — I4819 Other persistent atrial fibrillation: Secondary | ICD-10-CM

## 2024-01-08 DIAGNOSIS — I10 Essential (primary) hypertension: Secondary | ICD-10-CM

## 2024-01-08 NOTE — Patient Instructions (Signed)
   Follow-Up: At [facility], you and your health needs are our priority.  As part of our continuing mission to provide you with exceptional heart care, our providers are all part of one team.  This team includes your primary Cardiologist (physician) and Advanced Practice Providers or APPs (Physician Assistants and Nurse Practitioners) who all work together to provide you with the care you need, when you need it.  Your next appointment:   8-12 week(s)  Provider:   Redell Shallow, MD

## 2024-01-08 NOTE — Progress Notes (Signed)
 HPI: FU atrial fibrillation.  Patient previously followed at [facility].  She had an episode of atrial fibrillation and underwent cardioversion October 01, 2020 and had been treated with amiodarone. Amiodarone was discontinued by pulmonary ultimately due to history of bronchiectasis. Monitor January 2023 showed sinus rhythm with brief runs of PAT, PACs and PVCs and 1 triplet.  Patient has contacted the office multiple times due to perceived side effects from apixaban  and Xarelto . Patient was seen by Dr. Cindie for consideration of ablation and felt not to be a good candidate.  He recommended dofetilide therapy if she has recurrences.  He also felt that given supplemental oxygen use she would be at prohibitive risk for watchman implant.  Has been seen multiple times with recurrent atrial fibrillation.  She has declined dofetilide at this point.  Patient seen November 7 at [facility] with episode of atrial fibrillation that terminated in the emergency room.  Monitor April 2025 showed sinus rhythm with runs of atrial tachycardia.  Echocardiogram June 2025 showed normal LV function.  Since last seen, she denies dyspnea, chest pain, palpitations, syncope or bleeding.  Some fatigue.  Current Outpatient Medications  Medication Sig Dispense Refill   acetaminophen (TYLENOL) 500 MG tablet Take 1,000-1,500 mg by mouth See admin instructions. Take 1500 mg in the morning, 1000 mg at bedtime     apixaban  (ELIQUIS ) 5 MG TABS tablet Take 1 tablet (5 mg total) by mouth 2 (two) times daily. 60 tablet 5   atorvastatin (LIPITOR) 20 MG tablet Take 20 mg by mouth daily.     bisoprolol  (ZEBETA ) 5 MG tablet TAKE 1 TABLET BY MOUTH TWICE A DAY 180 tablet 3   clobetasol cream (TEMOVATE) 0.05 % Apply 1 Application topically 2 (two) times daily.     diltiazem (CARDIZEM CD) 120 MG 24 hr capsule Take 120 mg by mouth at bedtime.     Magnesium Chloride 64 MG TBEC Take 1 tablet by mouth daily.     olmesartan (BENICAR) 40 MG  tablet Take 20 mg by mouth in the morning and at bedtime.     omeprazole (PRILOSEC) 20 MG capsule Take 20 mg by mouth daily.     OXYGEN Place 2 L into the nose at bedtime.     alendronate (FOSAMAX) 70 MG tablet Take 70 mg by mouth once a week. (Patient not taking: Reported on 01/08/2024)     amLODipine  (NORVASC ) 5 MG tablet TAKE 1 TABLET BY MOUTH TWICE A DAY (Patient not taking: Reported on 01/08/2024) 180 tablet 0   HYDROcodone  bit-homatropine (HYCODAN) 5-1.5 MG/5ML syrup Take 5 mLs by mouth every 6 (six) hours as needed for cough. (Patient not taking: Reported on 01/08/2024) 120 mL 0   No current facility-administered medications for this visit.     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   Hyperlipidemia    Hypertension  Iron deficiency anemia 02/05/2018   Wears dentures    full upper dentures and partial lower     Past Surgical History:  Procedure Laterality Date   CARDIOVERSION N/A 05/10/2022   Procedure: CARDIOVERSION;  Surgeon: Shlomo Wilbert SAUNDERS, MD;  Location: [facility] ENDOSCOPY;  Service: Cardiovascular;  Laterality: N/A;   COLONOSCOPY  05/14/2009   Gessner/ adenoma 2007   COLONOSCOPY  05/2014   Dr Charmaine -[facility] - polyps   TOTAL HIP ARTHROPLASTY Right 2017   UPPER GASTROINTESTINAL ENDOSCOPY     vagiinal hysterectomy  1977   WISDOM TOOTH EXTRACTION      Social History   Socioeconomic History   Marital status: Widowed    Spouse name: Not on file   Number of children: 1    Years of education: Not on file   Highest education level: Not on file  Occupational History   Not on file  Tobacco Use   Smoking status: Former    Current packs/day: 0.00    Types: Cigarettes    Start date: 58    Quit date: 1968    Years since quitting: 57.5   Smokeless tobacco: Never   Tobacco comments:    Former smoker 02/24/23  Vaping Use   Vaping status: Never Used  Substance and Sexual Activity   Alcohol use: Never   Drug use: Never   Sexual activity: Not Currently    Birth control/protection: Post-menopausal  Other Topics Concern   Not on file  Social History Narrative   Married   Former smoker, no alcohol no drug use   Social Drivers of Corporate investment banker Strain: Low Risk  (03/01/2022)   Received from [facility] visits prior to 09/10/2022., [facility]   Overall Financial Resource Strain (CARDIA)    Difficulty of Paying Living Expenses: Not very hard  Food Insecurity: Low Risk  (01/04/2024)   Received from [facility]   Hunger Vital Sign    Within the past 12 months, you worried that your food would run out before you got money to buy more: Never true    Within the past 12 months, the food you bought just didn't last and you didn't have money to get more. : Never true  Transportation Needs: No Transportation Needs (01/04/2024)   Received from [facility]    In the past 12 months, has lack of reliable transportation kept you from medical appointments, meetings, work or from getting things needed for daily living? : No  Physical Activity: Insufficiently Active (03/01/2022)   Received from [facility] prior to 09/10/2022., [facility]   Exercise Vital Sign    On average, how many days per week do you engage in moderate to strenuous exercise (like a brisk walk)?: 1 day    On average, how many minutes do you engage in exercise at this level?: 30 min  Stress: No Stress Concern Present  (03/01/2022)   Received from [facility] visits prior to 09/10/2022., [facility]   [employer] of Occupational Health - Occupational Stress Questionnaire    Feeling of Stress : Only a little  Social Connections: Unknown (03/11/2023)   Received from [facility]   Social Network    Social Network: Not on file  Intimate Partner Violence: Unknown (03/11/2023)   Received from [facility]   HITS    Physically Hurt: Not on file    Insult or Talk Down To:  Not on file    Threaten Physical Harm: Not on file    Scream or Curse: Not on file    Family History  Problem Relation Age of Onset   Heart attack Father    Heart disease Sister    Heart disease Sister    Rectal cancer Neg Hx    Stomach cancer Neg Hx     ROS: no fevers or chills, productive cough, hemoptysis, dysphasia, odynophagia, melena, hematochezia, dysuria, hematuria, rash, seizure activity, orthopnea, PND, pedal edema, claudication. Remaining systems are negative.  Physical Exam: Well-developed well-nourished in no acute distress.  Skin is warm and dry.  HEENT is normal.  Neck is supple.  Chest is clear to auscultation with normal expansion.  Cardiovascular exam is regular rate and rhythm.  Abdominal exam nontender or distended. No masses palpated. Extremities show no edema. neuro grossly intact   A/P  1 persistent atrial fibrillation-patient is not a good candidate for amiodarone given history of lung disease and also felt not to be a candidate for ablation.  Continue Cardizem, bisoprolol  and apixaban .  Note she was felt to be a poor candidate for Watchman device.  We had a long discussion today concerning rate control versus rhythm control.  Not clear to me that she is symptomatic.  Best option for rhythm control would likely be Tikosyn and we discussed admission for that.  She is hesitant and would like to consider.  She will discuss with her daughter.  I will see her back in 8 to 12 weeks  for final decision.  She understands the longer she is in atrial fibrillation the more difficult to reestablish sinus rhythm.  Best option may be rate control long-term.  2 hyperlipidemia-continue statin.  3 hypertension-blood pressure controlled.  Continue present medications.  4 history of palpitations-continue beta-blocker.  Redell Shallow, MD

## 2024-01-08 NOTE — H&P (View-Only) (Signed)
 HPI: FU atrial fibrillation.  Patient previously followed at [facility].  She had an episode of atrial fibrillation and underwent cardioversion October 01, 2020 and had been treated with amiodarone. Amiodarone was discontinued by pulmonary ultimately due to history of bronchiectasis. Monitor January 2023 showed sinus rhythm with brief runs of PAT, PACs and PVCs and 1 triplet.  Patient has contacted the office multiple times due to perceived side effects from apixaban  and Xarelto . Patient was seen by Dr. Cindie for consideration of ablation and felt not to be a good candidate.  He recommended dofetilide therapy if she has recurrences.  He also felt that given supplemental oxygen use she would be at prohibitive risk for watchman implant.  Has been seen multiple times with recurrent atrial fibrillation.  She has declined dofetilide at this point.  Patient seen November 7 at [facility] with episode of atrial fibrillation that terminated in the emergency room.  Monitor April 2025 showed sinus rhythm with runs of atrial tachycardia.  Echocardiogram June 2025 showed normal LV function.  Since last seen, she denies dyspnea, chest pain, palpitations, syncope or bleeding.  Some fatigue.  Current Outpatient Medications  Medication Sig Dispense Refill   acetaminophen (TYLENOL) 500 MG tablet Take 1,000-1,500 mg by mouth See admin instructions. Take 1500 mg in the morning, 1000 mg at bedtime     apixaban  (ELIQUIS ) 5 MG TABS tablet Take 1 tablet (5 mg total) by mouth 2 (two) times daily. 60 tablet 5   atorvastatin (LIPITOR) 20 MG tablet Take 20 mg by mouth daily.     bisoprolol  (ZEBETA ) 5 MG tablet TAKE 1 TABLET BY MOUTH TWICE A DAY 180 tablet 3   clobetasol cream (TEMOVATE) 0.05 % Apply 1 Application topically 2 (two) times daily.     diltiazem (CARDIZEM CD) 120 MG 24 hr capsule Take 120 mg by mouth at bedtime.     Magnesium Chloride 64 MG TBEC Take 1 tablet by mouth daily.     olmesartan (BENICAR) 40 MG  tablet Take 20 mg by mouth in the morning and at bedtime.     omeprazole (PRILOSEC) 20 MG capsule Take 20 mg by mouth daily.     OXYGEN Place 2 L into the nose at bedtime.     alendronate (FOSAMAX) 70 MG tablet Take 70 mg by mouth once a week. (Patient not taking: Reported on 01/08/2024)     amLODipine  (NORVASC ) 5 MG tablet TAKE 1 TABLET BY MOUTH TWICE A DAY (Patient not taking: Reported on 01/08/2024) 180 tablet 0   HYDROcodone  bit-homatropine (HYCODAN) 5-1.5 MG/5ML syrup Take 5 mLs by mouth every 6 (six) hours as needed for cough. (Patient not taking: Reported on 01/08/2024) 120 mL 0   No current facility-administered medications for this visit.     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   Hyperlipidemia    Hypertension  Iron deficiency anemia 02/05/2018   Wears dentures    full upper dentures and partial lower     Past Surgical History:  Procedure Laterality Date   CARDIOVERSION N/A 05/10/2022   Procedure: CARDIOVERSION;  Surgeon: Shlomo Wilbert SAUNDERS, MD;  Location: [facility] ENDOSCOPY;  Service: Cardiovascular;  Laterality: N/A;   COLONOSCOPY  05/14/2009   Gessner/ adenoma 2007   COLONOSCOPY  05/2014   Dr Charmaine -[facility] - polyps   TOTAL HIP ARTHROPLASTY Right 2017   UPPER GASTROINTESTINAL ENDOSCOPY     vagiinal hysterectomy  1977   WISDOM TOOTH EXTRACTION      Social History   Socioeconomic History   Marital status: Widowed    Spouse name: Not on file   Number of children: 1    Years of education: Not on file   Highest education level: Not on file  Occupational History   Not on file  Tobacco Use   Smoking status: Former    Current packs/day: 0.00    Types: Cigarettes    Start date: 58    Quit date: 1968    Years since quitting: 57.5   Smokeless tobacco: Never   Tobacco comments:    Former smoker 02/24/23  Vaping Use   Vaping status: Never Used  Substance and Sexual Activity   Alcohol use: Never   Drug use: Never   Sexual activity: Not Currently    Birth control/protection: Post-menopausal  Other Topics Concern   Not on file  Social History Narrative   Married   Former smoker, no alcohol no drug use   Social Drivers of Corporate investment banker Strain: Low Risk  (03/01/2022)   Received from [facility] visits prior to 09/10/2022., [facility]   Overall Financial Resource Strain (CARDIA)    Difficulty of Paying Living Expenses: Not very hard  Food Insecurity: Low Risk  (01/04/2024)   Received from [facility]   Hunger Vital Sign    Within the past 12 months, you worried that your food would run out before you got money to buy more: Never true    Within the past 12 months, the food you bought just didn't last and you didn't have money to get more. : Never true  Transportation Needs: No Transportation Needs (01/04/2024)   Received from [facility]    In the past 12 months, has lack of reliable transportation kept you from medical appointments, meetings, work or from getting things needed for daily living? : No  Physical Activity: Insufficiently Active (03/01/2022)   [facility] prior to 09/10/2022., [facility]   Exercise Vital Sign    On average, how many days per week do you engage in moderate to strenuous exercise (like a brisk walk)?: 1 day    On average, how many minutes do you engage in exercise at this level?: 30 min  Stress: No Stress Concern Present  (03/01/2022)   Received from [facility] visits prior to 09/10/2022., [facility]   [employer] of Occupational Health - Occupational Stress Questionnaire    Feeling of Stress : Only a little  Social Connections: Unknown (03/11/2023)   Received from [facility]   Social Network    Social Network: Not on file  Intimate Partner Violence: Unknown (03/11/2023)   Received from [facility]   HITS    Physically Hurt: Not on file    Insult or Talk Down To:  Not on file    Threaten Physical Harm: Not on file    Scream or Curse: Not on file    Family History  Problem Relation Age of Onset   Heart attack Father    Heart disease Sister    Heart disease Sister    Rectal cancer Neg Hx    Stomach cancer Neg Hx     ROS: no fevers or chills, productive cough, hemoptysis, dysphasia, odynophagia, melena, hematochezia, dysuria, hematuria, rash, seizure activity, orthopnea, PND, pedal edema, claudication. Remaining systems are negative.  Physical Exam: Well-developed well-nourished in no acute distress.  Skin is warm and dry.  HEENT is normal.  Neck is supple.  Chest is clear to auscultation with normal expansion.  Cardiovascular exam is regular rate and rhythm.  Abdominal exam nontender or distended. No masses palpated. Extremities show no edema. neuro grossly intact   A/P  1 persistent atrial fibrillation-patient is not a good candidate for amiodarone given history of lung disease and also felt not to be a candidate for ablation.  Continue Cardizem, bisoprolol  and apixaban .  Note she was felt to be a poor candidate for Watchman device.  We had a long discussion today concerning rate control versus rhythm control.  Not clear to me that she is symptomatic.  Best option for rhythm control would likely be Tikosyn and we discussed admission for that.  She is hesitant and would like to consider.  She will discuss with her daughter.  I will see her back in 8 to 12 weeks  for final decision.  She understands the longer she is in atrial fibrillation the more difficult to reestablish sinus rhythm.  Best option may be rate control long-term.  2 hyperlipidemia-continue statin.  3 hypertension-blood pressure controlled.  Continue present medications.  4 history of palpitations-continue beta-blocker.  Redell Shallow, MD

## 2024-01-10 ENCOUNTER — Ambulatory Visit: Payer: Self-pay | Admitting: Cardiology

## 2024-01-15 ENCOUNTER — Other Ambulatory Visit: Payer: Self-pay | Admitting: *Deleted

## 2024-01-15 DIAGNOSIS — I4819 Other persistent atrial fibrillation: Secondary | ICD-10-CM

## 2024-01-17 ENCOUNTER — Telehealth: Payer: Self-pay | Admitting: Cardiology

## 2024-01-17 NOTE — Telephone Encounter (Signed)
 Patient c/o Palpitations:  STAT if patient reporting lightheadedness, shortness of breath, or chest pain  How long have you had palpitations/irregular HR/ Afib? Are you having the symptoms now?  1 week in Afib, not now but this morning  Are you currently experiencing lightheadedness, SOB or CP? No  Do you have a history of afib (atrial fibrillation) or irregular heart rhythm? yes  Have you checked your BP or HR? (document readings if available): HR 91, up to 130 this morning  Are you experiencing any other symptoms? No

## 2024-01-17 NOTE — Telephone Encounter (Signed)
 Spoke with Pt. Pt stated her HR was back down to the 70's but does feel she is still in Afib. Pt stated that during her first phone call her HR was 127. Reviewed Cardioversion appt time with pt on 7/11. Will route to provider as FYI.

## 2024-01-17 NOTE — Telephone Encounter (Signed)
 Left message to call back

## 2024-01-18 NOTE — Progress Notes (Signed)
 Pt called for pre procedure instructions. Arrival time 1000 NPO after midnight explained Instructed to take am meds with sip of water and confirmed blood thinner consistency, Eliquis  Instructed pt need for ride home tomorrow and have responsible adult with them for 24 hrs post procedure.

## 2024-01-19 ENCOUNTER — Encounter (HOSPITAL_COMMUNITY)
Admission: RE | Disposition: A | Discharge status: Home / Self Care | Payer: Self-pay | Source: Home / Self Care | Attending: Internal Medicine

## 2024-01-19 ENCOUNTER — Ambulatory Visit (HOSPITAL_BASED_OUTPATIENT_CLINIC_OR_DEPARTMENT_OTHER): Admitting: Anesthesiology

## 2024-01-19 ENCOUNTER — Other Ambulatory Visit: Payer: Self-pay

## 2024-01-19 ENCOUNTER — Ambulatory Visit (HOSPITAL_COMMUNITY): Admitting: Anesthesiology

## 2024-01-19 ENCOUNTER — Encounter (HOSPITAL_COMMUNITY): Payer: Self-pay | Admitting: Internal Medicine

## 2024-01-19 ENCOUNTER — Ambulatory Visit (HOSPITAL_COMMUNITY)
Admission: RE | Admit: 2024-01-19 | Discharge: 2024-01-19 | Disposition: A | Discharge status: Home / Self Care | Attending: Internal Medicine | Admitting: Internal Medicine

## 2024-01-19 DIAGNOSIS — Z79899 Other long term (current) drug therapy: Secondary | ICD-10-CM | POA: Insufficient documentation

## 2024-01-19 DIAGNOSIS — Z87891 Personal history of nicotine dependence: Secondary | ICD-10-CM

## 2024-01-19 DIAGNOSIS — I4819 Other persistent atrial fibrillation: Secondary | ICD-10-CM

## 2024-01-19 DIAGNOSIS — J479 Bronchiectasis, uncomplicated: Secondary | ICD-10-CM | POA: Insufficient documentation

## 2024-01-19 DIAGNOSIS — E785 Hyperlipidemia, unspecified: Secondary | ICD-10-CM | POA: Insufficient documentation

## 2024-01-19 DIAGNOSIS — E119 Type 2 diabetes mellitus without complications: Secondary | ICD-10-CM

## 2024-01-19 DIAGNOSIS — I10 Essential (primary) hypertension: Secondary | ICD-10-CM | POA: Diagnosis not present

## 2024-01-19 DIAGNOSIS — Z9981 Dependence on supplemental oxygen: Secondary | ICD-10-CM | POA: Insufficient documentation

## 2024-01-19 DIAGNOSIS — Z7901 Long term (current) use of anticoagulants: Secondary | ICD-10-CM | POA: Insufficient documentation

## 2024-01-19 HISTORY — PX: CARDIOVERSION: EP1203

## 2024-01-19 SURGERY — CARDIOVERSION (CATH LAB)
Anesthesia: General

## 2024-01-19 MED ORDER — SODIUM CHLORIDE 0.9% FLUSH: 3.0000 mL | INTRAVENOUS | Status: DC | PRN

## 2024-01-19 MED ORDER — LIDOCAINE 2% (20 MG/ML) 5 ML SYRINGE
INTRAMUSCULAR | Status: DC | PRN
  Administered 2024-01-19: 50 mg via INTRAVENOUS

## 2024-01-19 MED ORDER — PROPOFOL 10 MG/ML IV BOLUS
INTRAVENOUS | Status: DC | PRN
  Administered 2024-01-19: 50 mg via INTRAVENOUS

## 2024-01-19 MED ORDER — SODIUM CHLORIDE 0.9% FLUSH: 3.0000 mL | Freq: Two times a day (BID) | INTRAVENOUS | Status: DC

## 2024-01-19 SURGICAL SUPPLY — 1 items: PAD DEFIB RADIO PHYSIO CONN (PAD) ×2 IMPLANT

## 2024-01-19 NOTE — CV Procedure (Signed)
 Electrical Cardioversion Procedure Note [name] [name] [id number] 1940-10-08  Procedure: Electrical Cardioversion Indications:  Atrial Fibrillation  Procedure Details Consent: Risks of procedure as well as the alternatives and risks of each were explained to the (patient/caregiver).  Consent for procedure obtained. Time Out: Verified patient identification, verified procedure, site/side was marked, verified correct patient position, special equipment/implants available, medications/allergies/relevent history reviewed, required imaging and test results available.  Performed  Patient placed on cardiac monitor, pulse oximetry, supplemental oxygen as necessary.  Sedation given: propofol  and lidocaine   Pacer pads placed anterior and posterior chest.  Cardioverted 1 time(s).  Cardioverted at 200J synchronized biphasic energy  Evaluation Findings: Post procedure EKG shows: NSR Complications: None Patient did tolerate procedure well.   [name] [name] 01/19/2024, 11:13 AM

## 2024-01-19 NOTE — Anesthesia Preprocedure Evaluation (Signed)
 Anesthesia Evaluation  Patient identified by MRN, date of birth, ID band Patient awake    Reviewed: Allergy & Precautions, NPO status , Patient's Chart, lab work & pertinent test results  Airway Mallampati: II  TM Distance: >3 FB Neck ROM: Full    Dental  (+) Dental Advisory Given   Pulmonary former smoker Bronchiectasis on O2 2L [place] at night   breath sounds clear to auscultation       Cardiovascular hypertension, Pt. on medications and Pt. on home beta blockers + dysrhythmias Atrial Fibrillation  Rhythm:Irregular Rate:Normal  Echo 12/2023 (CE) Technically adequate. Ectopy during study.  The left ventricular size is normal.  Mild septal LVH.  Left ventricular systolic function is normal.  LV ejection fraction = 60-65%.  The right ventricle is normal in size and function.  The left atrial size is normal.  There is mild aortic valve thickening.  There is no aortic stenosis.  IVC size was normal.  There is no pericardial effusion.    The left ventricular size is normal. Mild septal LVH. Left ventricular systolic function is normal. LV ejection fraction = 60-65%. The left ventricular wall motion is normal.   The right ventricle is normal in size and function.   The left atrial size is normal.    Right atrial size is normal.   There is mild aortic valve thickening. There is no aortic stenosis. There is no aortic regurgitation.   The mitral valve leaflets appear normal. There is trace mitral regurgitation.   There is trivial tricuspid valve thickening. There is trace tricuspid regurgitation.   The aortic sinus is normal size.   IVC size was normal.   There is no pericardial effusio     Neuro/Psych  PSYCHIATRIC DISORDERS  Depression    negative neurological ROS     GI/Hepatic Neg liver ROS,GERD  ,,  Endo/Other  diabetes, Type 2    Renal/GU negative Renal ROS     Musculoskeletal  (+) Arthritis ,    Abdominal    Peds  Hematology  (+) Blood dyscrasia, anemia   Anesthesia Other Findings   Reproductive/Obstetrics                              Anesthesia Physical Anesthesia Plan  ASA: 3  Anesthesia Plan: General   Post-op Pain Management: Minimal or no pain anticipated   Induction: Intravenous  PONV Risk Score and Plan: 3 and Treatment may vary due to age or medical condition  Airway Management Planned: Natural Airway and Mask  Additional Equipment:   Intra-op Plan:   Post-operative Plan:   Informed Consent: I have reviewed the patients History and Physical, chart, labs and discussed the procedure including the risks, benefits and alternatives for the proposed anesthesia with the patient or authorized representative who has indicated his/her understanding and acceptance.     Dental advisory given  Plan Discussed with: CRNA  Anesthesia Plan Comments:          Anesthesia Quick Evaluation

## 2024-01-19 NOTE — Interval H&P Note (Signed)
 History and Physical Interval Note:  01/19/2024 10:46 AM Pt seen in cardiology clinic on 6/30 as noted.  She contacted office on 7/3 with tachypalpitations.  Given this she was scheduled for DC cardioversion. [name] [name]  has presented today for surgery, with the diagnosis of afib.  The various methods of treatment have been discussed with the patient and family. After consideration of risks, benefits and other options for treatment, the patient has consented to  Procedure(s): CARDIOVERSION (N/A) as a surgical intervention.  The patient's history has been reviewed, patient examined, no change in status, stable for surgery.  I have reviewed the patient's chart and labs.  Questions were answered to the patient's satisfaction.     [name]

## 2024-01-19 NOTE — Transfer of Care (Signed)
 Immediate Anesthesia Transfer of Care Note  Patient: [name] [name]  Procedure(s) Performed: CARDIOVERSION  Patient Location: Cath Lab  Anesthesia Type:General  Level of Consciousness: drowsy and patient cooperative  Airway & Oxygen Therapy: Patient Spontanous Breathing and Patient connected to nasal cannula oxygen  Post-op Assessment: Report given to RN and Post -op Vital signs reviewed and stable  Post vital signs: Reviewed and stable  Last Vitals:  Vitals Value Taken Time  BP 137/63 01/19/24 11:10   Temp    Pulse 107 01/19/24 10:58  Resp 16 01/19/24 10:58  SpO2 92 % 01/19/24 10:58  Vitals shown include unfiled device data.  Last Pain:  Vitals:   01/19/24 1020  TempSrc:   PainSc: 0-No pain         Complications: There were no known notable events for this encounter.

## 2024-01-22 NOTE — Anesthesia Postprocedure Evaluation (Signed)
 Anesthesia Post Note  Patient: [name] [name]  Procedure(s) Performed: CARDIOVERSION     Patient location during evaluation: Cath Lab Anesthesia Type: General Level of consciousness: sedated and patient cooperative Pain management: pain level controlled Vital Signs Assessment: post-procedure vital signs reviewed and stable Respiratory status: spontaneous breathing Cardiovascular status: stable Anesthetic complications: no   There were no known notable events for this encounter.  Last Vitals:  Vitals:   01/19/24 1140 01/19/24 1151  BP: 137/68 (!) 149/65  Pulse: (!) 50 (!) 49  Resp: (!) 22 17  Temp:    SpO2: 95% 95%    Last Pain:  Vitals:   01/19/24 1020  TempSrc:   PainSc: 0-No pain                 Norleen Pope

## 2024-01-23 ENCOUNTER — Telehealth: Payer: Self-pay | Admitting: Cardiology

## 2024-01-23 DIAGNOSIS — Z79899 Other long term (current) drug therapy: Secondary | ICD-10-CM

## 2024-01-23 DIAGNOSIS — R944 Abnormal results of kidney function studies: Secondary | ICD-10-CM

## 2024-01-23 NOTE — Telephone Encounter (Signed)
  Patient sent message through MyChart scheduling pool asking for a call back

## 2024-01-23 NOTE — Telephone Encounter (Signed)
 Left message and call back number for pt to return call

## 2024-01-23 NOTE — Telephone Encounter (Signed)
  Per MyChart scheduling message:  Had recent cardioversion at [facility] . Do I need to have changes made to the meds I presently take for blood pressure.

## 2024-01-24 MED ORDER — OLMESARTAN MEDOXOMIL 20 MG PO TABS
20.0000 mg | ORAL_TABLET | Freq: Every day | ORAL | 3 refills | Status: DC
Start: 2024-01-24 — End: 2024-02-07

## 2024-01-24 NOTE — Telephone Encounter (Signed)
 Upon review, make sure that she is not on diltiazem and also on amlodipine .  Continue diltiazem.  With regard to hypertension, I noticed that her BMP revealed worsening renal function recently, serum creatinine had increased last BMP, do not know if olmesartan  was held due to this by her PCP.  Patient previously was on 40 mg of olmesartan .  I would recommend that we start 20 mg of olmesartan  and check BMP in 2 weeks.   Last BMP on 01/15/2019 found Care Everywhere included BUN 10, creatinine 1.01, EGFR 55 mL.  Compared that to 01/04/2024 creatinine was 0.73 with EGFR 81 mL and also BMP on 09/29/2023, EGFR was 63 mL.  As long as GFR does not fall below 55, it should be fine.

## 2024-01-24 NOTE — Telephone Encounter (Signed)
 Called the patient and gave information from Dr Ladona. Sent prescription for Olmesartan  20 mg daily and ordered BMP. She will need to have this drawn when she comes in for her appt on 02/07/24. Informed her that she can have this drawn at our lab downstairs after the appointment. She said that her pcp told her that she needed to see a kidney doctor due to changes in her lab work, she said she will call to get an appointment. If she needs a referral she can ask her pcp or ask at the upcoming appointment.  She verbalized understanding and will keep a BP log and bring to appt.

## 2024-01-24 NOTE — Telephone Encounter (Signed)
 She states that all of her medications were changed before the cardioversion. Amlodipine  and Olmasartan were taken away and Diltazem was added. Wondering if medication needs to be changed/added back  She has been keeping a log of BP and hr Hr is in 60's now and O2 97% 7/15- 176/75  7/14- 152/76 7/13-172/82 7/12- 180/87  She also reports some swelling in right foot and ankle- it is puffy. Gets better overnight, then becomes puffy throughout the day.   Instructed to wear compression socks during the day- take them off at night and elevate legs intermittently throughout the day. Instructed to continue to keep a log of BP along with hr- take 1-2 hours after taking medication.   Appt made with APP 02/07/24. Also has an appt with Dr Pietro 04/01/24.    Informed that I would give this information to her provider and call her back if any med changes need to be made prior to her appt on 02/07/24.

## 2024-01-26 ENCOUNTER — Ambulatory Visit
Admission: EM | Admit: 2024-01-26 | Discharge: 2024-01-26 | Disposition: A | Discharge status: Home / Self Care | Attending: Family Medicine | Admitting: Family Medicine

## 2024-01-26 ENCOUNTER — Other Ambulatory Visit: Payer: Self-pay

## 2024-01-26 DIAGNOSIS — R21 Rash and other nonspecific skin eruption: Secondary | ICD-10-CM

## 2024-01-26 MED ORDER — TRIAMCINOLONE ACETONIDE 0.1 % EX CREA: 1.0000 | TOPICAL_CREAM | Freq: Two times a day (BID) | CUTANEOUS | 1 refills | Status: AC

## 2024-01-26 NOTE — ED Provider Notes (Signed)
 [name]    CSN: [id number] Arrival date & time: 01/26/24  1159      History   Chief Complaint Chief Complaint  Patient presents with   Rash    HPI Elisabetta Mishra is a 83 y.o. female.   Last week patient developed a mildly pruritic rash primarily on the dorsa of her feet and ankles, with a few small lesions on her breasts and around her mouth. She believes it may be a result of beginning diltiazem recently for atrial fib (she had cardioversion on July 11).  She states that she has had this rash before.  She has been applying CeraVe skin cream which has been somewhat helpful.  The history is provided by the patient.  Rash Location: feet. Quality: dryness, itchiness and scaling   Quality: not blistering, not burning, not draining, not painful, not peeling, not red, not swelling and not weeping   Severity:  Mild Onset quality:  Gradual Timing:  Constant Progression:  Unchanged Chronicity:  Recurrent Context: medications   Context: not animal contact, not chemical exposure, not food, not insect bite/sting, not new detergent/soap and not plant contact   Relieved by:  Nothing Worsened by:  Nothing Associated symptoms: no fever, no induration, no myalgias and no sore throat     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   Hyperlipidemia    Hypertension    Iron deficiency anemia 02/05/2018   Wears dentures    full upper dentures and partial lower     Patient Active Problem  List   Diagnosis Date Noted   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) 05/17/2022   PAF (paroxysmal atrial fibrillation) (HCC)    Iron deficiency anemia 02/05/2018   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/05/2018   HYPERLIPIDEMIA 08/31/2007   Essential hypertension 08/31/2007   GERD 08/31/2007   Diaphragmatic hernia 08/31/2007   Constipation 08/31/2007   PALPITATIONS 08/31/2007    Past Surgical History:  Procedure Laterality Date   CARDIOVERSION N/A 05/10/2022   Procedure: CARDIOVERSION;  Surgeon: Shlomo Wilbert SAUNDERS, MD;  Location: [facility] ENDOSCOPY;  Service: Cardiovascular;  Laterality: N/A;   CARDIOVERSION N/A 01/19/2024   Procedure: CARDIOVERSION;  Surgeon: Okey Vina GAILS, MD;  Location: [facility] INVASIVE CV LAB;  Service: Cardiovascular;  Laterality: N/A;   COLONOSCOPY  05/14/2009   Gessner/ adenoma 2007   COLONOSCOPY  05/2014   Dr Charmaine -[facility] - polyps   TOTAL HIP ARTHROPLASTY Right 2017   UPPER GASTROINTESTINAL ENDOSCOPY     vagiinal hysterectomy  1977   WISDOM TOOTH EXTRACTION      OB History   No obstetric history on file.      Home Medications    Prior to Admission medications   Medication Sig Start Date End Date Taking? Authorizing Provider  triamcinolone  cream (KENALOG ) 0.1 % Apply 1 Application topically 2 (two) times daily. 01/26/24  Yes Blima Jaimes,  Garnette LABOR, MD  acetaminophen (TYLENOL) 500 MG tablet Take 1,000-1,500 mg by mouth See admin instructions. Take 1500 mg in the morning, 1000 mg at bedtime    [provider]  apixaban  (ELIQUIS ) 5 MG TABS tablet Take 1 tablet (5 mg total) by mouth 2 (two) times daily. 06/26/23   Pietro Redell RAMAN, MD  atorvastatin (LIPITOR) 20 MG tablet Take 20 mg by mouth daily.    [provider]  bisoprolol  (ZEBETA ) 5 MG tablet TAKE 1 TABLET BY MOUTH TWICE A DAY 06/05/23   Pietro Redell RAMAN, MD  diltiazem (TIAZAC) 120 MG 24 hr capsule Take 120 mg by mouth daily. 01/06/24 07/04/24  [provider]  MAGNESIUM GLYCINATE PO Take 200 mg by mouth daily.    [provider]  olmesartan  (BENICAR ) 20 MG tablet Take 1 tablet (20 mg total) by mouth daily. 01/24/24   Ladona Heinz, MD  omeprazole (PRILOSEC) 20 MG capsule Take 20 mg by mouth daily. 08/14/14   [provider]  OXYGEN Place 2 L into the nose at bedtime.    [provider]    Family History Family History  Problem Relation Age of Onset   Heart attack Father    Heart disease Sister    Heart disease Sister    Rectal cancer Neg Hx    Stomach cancer Neg Hx     Social History Social History   Tobacco Use   Smoking status: Former    Current packs/day: 0.00    Types: Cigarettes    Start date: 1958    Quit date: 1968    Years since quitting: 57.5   Smokeless tobacco: Never   Tobacco comments:    Former smoker 02/24/23  Vaping Use   Vaping status: Never Used  Substance Use Topics   Alcohol use: Never   Drug use: Never     Allergies   Codeine, Ace inhibitors, Cold relief plus  [chlorphen-phenyleph-asa], Epinephrine, Fexofenadine hcl, and Metoclopramide   Review of Systems Review of Systems  Constitutional:  Negative for chills, diaphoresis and fever.  HENT:  Negative for sore throat.   Cardiovascular:  Positive for leg swelling. Negative for chest pain.  Musculoskeletal:  Negative for myalgias.  Skin:  Positive for rash.  All other systems reviewed and are negative.    Physical Exam Triage Vital Signs ED Triage Vitals  Encounter Vitals Group     BP 01/26/24 1255 (!) 163/74     Girls Systolic BP Percentile --      Girls Diastolic BP Percentile --      Boys Systolic BP Percentile --      Boys Diastolic BP Percentile --      Pulse Rate 01/26/24 1255 66     Resp 01/26/24 1255 16     Temp 01/26/24 1255 98 F (36.7 C)     Temp src --      SpO2 01/26/24 1255 94 %     Weight --      Height --      Head Circumference --      Peak Flow --      Pain Score 01/26/24 1259 3     Pain Loc  --      Pain Education --      Exclude from Growth Chart --    No data found.  Updated Vital Signs BP (!) 163/74   Pulse 66   Temp 98 F (36.7 C)   Resp 16   SpO2  94%   Visual Acuity Right Eye Distance:   Left Eye Distance:   Bilateral Distance:    Right Eye Near:   Left Eye Near:    Bilateral Near:     Physical Exam Constitutional:      General: She is not in acute distress.    Appearance: She is not ill-appearing.  HENT:     Head: Normocephalic.     Mouth/Throat:     Pharynx: Oropharynx is clear.  Eyes:     Conjunctiva/sclera: Conjunctivae normal.     Pupils: Pupils are equal, round, and reactive to light.  Cardiovascular:     Rate and Rhythm: Normal rate.  Pulmonary:     Effort: Pulmonary effort is normal.  Musculoskeletal:     Right lower leg: Edema present.     Left lower leg: Edema present.       Feet:     Comments: Dorsa of feet have scattered scaly target lesions.  Minimal erythema.  No tenderness to palpation   Skin:    General: Skin is warm and dry.  Neurological:     Mental Status: She is alert.      UC Treatments / Results  Labs (all labs ordered are listed, but only abnormal results are displayed) Labs Reviewed - No data to display  EKG   Radiology No results found.  Procedures Procedures (including critical care time)  Medications Ordered in UC Medications - No data to display  Initial Impression / Assessment and Plan / UC Course  I have reviewed the triage vital signs and the nursing notes.  Pertinent labs & imaging results that were available during my care of the patient were reviewed by me and considered in my medical decision making (see chart for details).    Suspect erythema multiforme, probably resulting from recent initiation diltiazem.  For now, begin triamcinolone  0.1% topical cream.  Followup with cardiologist to consider switch to different anti-arrthythmic  Final Clinical Impressions(s) / UC Diagnoses   Final  diagnoses:  Rash and nonspecific skin eruption     Discharge Instructions      Resume taking Diltiazem as prescribed.  If symptoms become significantly worse during the night or over the weekend, proceed to the local emergency room.      ED Prescriptions     Medication Sig Dispense Auth. Provider   triamcinolone  cream (KENALOG ) 0.1 % Apply 1 Application topically 2 (two) times daily. 45 g Pauline Garnette LABOR, MD         Pauline Garnette LABOR, MD 01/27/24 2212

## 2024-01-26 NOTE — ED Triage Notes (Signed)
 Rash to feet, legs, around mouth, some to breast since last week. Has had before. Wonders if it is d/t diltiazem. Has used cerave cream, which helps some.

## 2024-01-26 NOTE — Discharge Instructions (Signed)
 Resume taking Diltiazem as prescribed.  If symptoms become significantly worse during the night or over the weekend, proceed to the local emergency room.

## 2024-02-05 LAB — BASIC METABOLIC PANEL WITH GFR
BUN/Creatinine Ratio: 15 (ref 12–28)
BUN: 14 mg/dL (ref 8–27)
CO2: 23 mmol/L (ref 20–29)
Calcium: 8.3 mg/dL — ABNORMAL LOW (ref 8.7–10.3)
Chloride: 94 mmol/L — ABNORMAL LOW (ref 96–106)
Creatinine, Ser: 0.96 mg/dL (ref 0.57–1.00)
Glucose: 128 mg/dL — ABNORMAL HIGH (ref 70–99)
Potassium: 4.8 mmol/L (ref 3.5–5.2)
Sodium: 131 mmol/L — ABNORMAL LOW (ref 134–144)
eGFR: 58 mL/min/1.73 — ABNORMAL LOW (ref >59)

## 2024-02-06 ENCOUNTER — Ambulatory Visit: Payer: Self-pay | Admitting: Cardiology

## 2024-02-06 NOTE — Progress Notes (Signed)
 Reviewed multiple recent labs from June through July 2025, Na is low and stable at 131. 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 Will let PCP do the work up. No change since initiation of Olmesartan  with regards to sodium or s, cr

## 2024-02-07 ENCOUNTER — Ambulatory Visit: Attending: Cardiology | Admitting: Cardiology

## 2024-02-07 VITALS — BP 158/66 | HR 57 | Ht 65.5 in | Wt 181.2 lb

## 2024-02-07 DIAGNOSIS — I4819 Other persistent atrial fibrillation: Secondary | ICD-10-CM | POA: Diagnosis not present

## 2024-02-07 DIAGNOSIS — I10 Essential (primary) hypertension: Secondary | ICD-10-CM | POA: Diagnosis not present

## 2024-02-07 DIAGNOSIS — R002 Palpitations: Secondary | ICD-10-CM

## 2024-02-07 DIAGNOSIS — I480 Paroxysmal atrial fibrillation: Secondary | ICD-10-CM | POA: Diagnosis not present

## 2024-02-07 MED ORDER — DILTIAZEM HCL 30 MG PO TABS: 30.0000 mg | ORAL_TABLET | Freq: Every day | ORAL | 3 refills | Status: DC | PRN

## 2024-02-07 MED ORDER — OLMESARTAN MEDOXOMIL 20 MG PO TABS: 20.0000 mg | ORAL_TABLET | Freq: Every day | ORAL | 3 refills | Status: DC

## 2024-02-07 NOTE — Progress Notes (Addendum)
 " Cardiology Office Note:   Date:  02/08/2024  ID:  [name] [name], DOB [birth year], MRN [id number] PCP: Juliene Asberry NOVAK, DO  [facility] Providers Cardiologist:  Redell Shallow, MD Electrophysiologist:  OLE ONEIDA HOLTS, MD    History of Present Illness:    Discussed the use of AI scribe software for clinical note transcription with the patient, who gave verbal consent to proceed.  History of Present Illness [name] [name] is an 83 year old female with paroxysmal atrial fibrillation who presents for follow-up after cardioversion and for blood pressure management.  She has a history of paroxysmal atrial fibrillation, having undergone three cardioversions since February 2022, with the most recent in July 2025. She has experienced at least eight episodes of symptomatic atrial fibrillation since February 2022, some of which resolved spontaneously. She was previously treated with amiodarone, which was discontinued, and is currently on diltiazem  120 mg once daily. This was started by her cardiologist at [facility] (se[facility] them and [facility]). She says she prefers the brand name Cardizem  over the generic version. She reports that since starting diltiazem /most recent cardioversion, she has not experienced palpitations, skipped beats, or racing heart sensations.  Recent blood pressure readings include 158/66 and 160/68. She was previously on olmesartan , which was stopped when she started diltiazem , and has not resumed it due to confusion about her medication regimen. (We recommended resuming 20mg  daily after she called in a couple of weeks ago but she didn't resume). Home blood pressure readings sometimes in the 130s, which she considers low. She also takes bisoprolol  5 mg at night.  An echocardiogram in January 2022 showed normal left ventricular ejection fraction. More recently, June 2025 TTE at [facility] showed normal LVEF.  She feels more tired than usual, attributing it to her age and the hot  summer. No shortness of breath or significant swelling in her legs, though she notes some swelling related to arthritis and compromised circulation in one leg.  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 Her sodium was 133 mmol/L eleven months ago and 131 mmol/L two days ago.    Today patient denies chest pain, shortness of breath, lower extremity edema, fatigue, palpitations, melena, hematuria, hemoptysis, diaphoresis, weakness, presyncope, syncope, orthopnea, and PND.   Studies Reviewed:    EKG:   EKG Interpretation Date/Time:  Wednesday February 07 2024 10:37:11 EDT Ventricular Rate:  57 PR Interval:  148 QRS Duration:  74 QT Interval:  414 QTC Calculation: 402 R Axis:   8  Text Interpretation: Sinus bradycardia Minimal voltage criteria for LVH, may be normal variant ( R in aVL ) Cannot rule out Anterior infarct , age undetermined When compared with ECG of 19-Jan-2024 11:18, Nonspecific T wave abnormality, worse in Anterior leads Confirmed by Trudy Birmingham [phone]) on 02/07/2024 10:49:42 AM    01/04/24 TTE  The left ventricular size is normal.  Mild septal LVH.  Left ventricular systolic function is normal.  LV ejection fraction = 60-65%.  The right ventricle is normal in size and function.  The left atrial size is normal.  There is mild aortic valve thickening.  There is no aortic stenosis.  IVC size was normal.  There is no pericardial effusion.  LEFT VENTRICLE  The left ventricular size is normal. Mild septal LVH. Left ventricular systolic function is normal.  LV ejection fraction = 60-65%. The left ventricular wall motion is normal.  RIGHT VENTRICLE  The right ventricle is normal in size and function.  LEFT ATRIUM  The left atrial size is normal.  RIGHT ATRIUM  Right atrial size is normal.  AORTIC VALVE  There is mild aortic valve thickening. There is no  aortic stenosis. There is no aortic regurgitation.  MITRAL VALVE  The mitral valve leaflets appear normal. There is trace mitral regurgitation.  TRICUSPID VALVE  There is trivial tricuspid valve thickening. There is trace tricuspid regurgitation.  PULMONIC VALVE  The pulmonic valve is normal in structure and function.  ARTERIES  The aortic sinus is normal size.  VENOUS  IVC size was normal.  EFFUSION  There is no pericardial effusion.    Risk Assessment/Calculations:    CHA2DS2-VASc Score = 6   This indicates a 9.7% annual risk of stroke. The patient's score is based upon: CHF History: 0 HTN History: 1 Diabetes History: 1 Stroke History: 0 Vascular Disease History: 1 Age Score: 2 Gender Score: 1    HYPERTENSION CONTROL Vitals:   02/07/24 1034 02/07/24 1126  BP: (!) 160/68 (!) 158/66    The patient's blood pressure is elevated above target today.  In order to address the patient's elevated BP: A new medication was prescribed today.           Physical Exam:   VS:  BP (!) 158/66   Pulse (!) 57   Ht 5' 5.5 (1.664 m)   Wt 181 lb 3.2 oz (82.2 kg)   SpO2 96%   BMI 29.69 kg/m    Wt Readings from Last 3 Encounters:  02/07/24 181 lb 3.2 oz (82.2 kg)  01/19/24 180 lb (81.6 kg)  01/08/24 180 lb (81.6 kg)     Physical Exam Vitals reviewed.  Constitutional:      Appearance: Normal appearance.  HENT:     Head: Normocephalic.     Nose: Nose normal.  Eyes:     Pupils: Pupils are equal, round, and reactive to light.  Cardiovascular:     Rate and Rhythm: Normal rate and regular rhythm.     Pulses: Normal pulses.     Heart sounds: Normal heart sounds. No murmur heard.    No friction rub. No gallop.  Pulmonary:     Effort: Pulmonary effort is normal.     Breath sounds: Normal breath sounds.  Abdominal:     General: Abdomen is flat.  Musculoskeletal:     Right lower leg: No edema.     Left lower leg: No edema.  Skin:    General: Skin is warm and dry.      Capillary Refill: Capillary refill takes less than 2 seconds.  Neurological:     General: No focal deficit present.     Mental Status: She is alert and oriented to person, place, and time.  Psychiatric:        Mood and Affect: Mood normal.        Behavior: Behavior normal.        Thought Content: Thought content normal.        Judgment: Judgment normal.     ASSESSMENT AND PLAN:    Assessment & Plan Paroxysmal atrial fibrillation Paroxysmal atrial fibrillation with episodes since February 2022, has had multiple cardioversions. Currently in sinus rhythm post-cardioversion in July 2025. No recent palpitations or skipped beats since starting diltiazem . Discussed potential use of Tikosyn for recurrent AFib, with risks including rare fatal arrhythmias, but she is hesitant due to age and potential side effects. Reassured  that Tikosyn has a good safety profile - Continue diltiazem  120 mg daily in the morning. - Prescribe short-acting diltiazem  30 mg pill in pocket for breakthrough AFib episodes. - Discuss Tikosyn with Dr. Pietro in September  - Continue Eliquis  5mg  BID  Essential hypertension Hypertension with recent elevated readings, including 158/66 today. Previously on olmesartan , which was discontinued. Blood pressure management complicated by medication changes and miscommunication.  - Restart olmesartan  20 mg once daily. - Diltiazem  as above - Continue Bisoprolol  - Check kidney function and electrolytes in two weeks at [facility].  Hyperlipidemia Continue statin.           Signed, Artist Pouch, PA-C   "

## 2024-02-07 NOTE — Patient Instructions (Signed)
 Medication Instructions:  START Olmesartan  20 mg once daily   AS NEEDED Diltiazem  30 mg once daily   *If you need a refill on your cardiac medications before your next appointment, please call your pharmacy*  Lab Work: To be completed in 1-2 weeks: BMP  If you have labs (blood work) drawn today and your tests are completely normal, you will receive your results only by: MyChart Message (if you have MyChart) OR A paper copy in the mail If you have any lab test that is abnormal or we need to change your treatment, we will call you to review the results.  Testing/Procedures: No tests ordered.  Follow-Up: At [facility], you and your health needs are our priority.  As part of our continuing mission to provide you with exceptional heart care, our providers are all part of one team.  This team includes your primary Cardiologist (physician) and Advanced Practice Providers or APPs (Physician Assistants and Nurse Practitioners) who all work together to provide you with the care you need, when you need it.  Your next appointment:   04/01/24 at 1 PM  Provider:   Redell Shallow, MD    We recommend signing up for the patient portal called MyChart.  Sign up information is provided on this After Visit Summary.  MyChart is used to connect with patients for Virtual Visits (Telemedicine).  Patients are able to view lab/test results, encounter notes, upcoming appointments, etc.  Non-urgent messages can be sent to your provider as well.   To learn more about what you can do with MyChart, go to [email or web address].

## 2024-02-15 ENCOUNTER — Ambulatory Visit: Payer: Self-pay | Admitting: Cardiology

## 2024-02-15 LAB — BASIC METABOLIC PANEL WITH GFR
BUN/Creatinine Ratio: 13 (ref 12–28)
BUN: 12 mg/dL (ref 8–27)
CO2: 21 mmol/L (ref 20–29)
Calcium: 9 mg/dL (ref 8.7–10.3)
Chloride: 94 mmol/L — ABNORMAL LOW (ref 96–106)
Creatinine, Ser: 0.93 mg/dL (ref 0.57–1.00)
Glucose: 147 mg/dL — ABNORMAL HIGH (ref 70–99)
Potassium: 4.9 mmol/L (ref 3.5–5.2)
Sodium: 132 mmol/L — ABNORMAL LOW (ref 134–144)
eGFR: 61 mL/min/1.73 (ref >59)

## 2024-03-09 ENCOUNTER — Ambulatory Visit
Admission: EM | Admit: 2024-03-09 | Discharge: 2024-03-09 | Disposition: A | Discharge status: Home / Self Care | Attending: Family Medicine | Admitting: Family Medicine

## 2024-03-09 DIAGNOSIS — R220 Localized swelling, mass and lump, head: Secondary | ICD-10-CM

## 2024-03-09 DIAGNOSIS — T7840XA Allergy, unspecified, initial encounter: Secondary | ICD-10-CM | POA: Diagnosis not present

## 2024-03-09 NOTE — ED Provider Notes (Signed)
 [name]    CSN: [id number] Arrival date & time: 03/09/24  9076      History   Chief Complaint Chief Complaint  Patient presents with   Facial Swelling    Facial swelling, itching and redness.     HPI [name] [name] is a 83 y.o. female.   HPI Patient woke up this morning with swelling across both cheeks.  It is itchy.  She has never had an allergic reaction before.  She thinks it might be from her oxygen tubing since it is in a similar distribution.  She has been using the same oxygen tubing for months.  No new foods.  No new lotion or soap.  No trouble breathing or shortness of breath.  No other rash.  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   Hyperlipidemia    Hypertension    Iron deficiency anemia 02/05/2018   Wears dentures    full upper dentures and partial lower     Patient Active Problem List   Diagnosis Date Noted   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) 05/17/2022   PAF (paroxysmal atrial fibrillation) (HCC)    Iron deficiency anemia 02/05/2018   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/05/2018   HYPERLIPIDEMIA 08/31/2007   Essential hypertension 08/31/2007   GERD 08/31/2007   Diaphragmatic hernia 08/31/2007   Constipation 08/31/2007   PALPITATIONS 08/31/2007    Past Surgical History:  Procedure Laterality Date   CARDIOVERSION N/A 05/10/2022   Procedure: CARDIOVERSION;  Surgeon: Shlomo Wilbert SAUNDERS, MD;  Location: [facility] ENDOSCOPY;  Service:  Cardiovascular;  Laterality: N/A;   CARDIOVERSION N/A 01/19/2024   Procedure: CARDIOVERSION;  Surgeon: Okey Vina GAILS, MD;  Location: [facility] INVASIVE CV LAB;  Service: Cardiovascular;  Laterality: N/A;   COLONOSCOPY  05/14/2009   Gessner/ adenoma 2007   COLONOSCOPY  05/2014   Dr Charmaine -[facility] - polyps   TOTAL HIP ARTHROPLASTY Right 2017   UPPER GASTROINTESTINAL ENDOSCOPY     vagiinal hysterectomy  1977   WISDOM TOOTH EXTRACTION      OB History   No obstetric history on file.      Home Medications    Prior to Admission medications   Medication Sig Start Date End Date Taking? Authorizing Provider  acetaminophen (TYLENOL) 500 MG tablet Take 1,000-1,500 mg by mouth See admin instructions. Take 1500 mg in the morning, 1000 mg at bedtime   Yes [provider]  apixaban  (ELIQUIS ) 5 MG TABS tablet Take 1 tablet (5 mg total) by mouth 2 (two) times daily. 06/26/23  Yes Pietro Redell RAMAN, MD  atorvastatin (LIPITOR) 20 MG tablet Take 20 mg by mouth daily.   Yes [provider]  bisoprolol  (ZEBETA ) 5 MG tablet TAKE 1 TABLET BY MOUTH TWICE A DAY 06/05/23  Yes Crenshaw, Redell RAMAN, MD  MAGNESIUM GLYCINATE PO Take 200 mg by mouth daily.   Yes [provider]  olmesartan  (BENICAR ) 20 MG tablet Take 1 tablet (20 mg total) by mouth daily. 02/07/24  Yes Williams, Evan, PA-C  omeprazole (PRILOSEC) 20 MG capsule Take 20 mg by mouth daily. 08/14/14  Yes [provider]  OXYGEN Place 2 L into the nose at bedtime.   Yes [provider]  triamcinolone  cream (KENALOG ) 0.1 % Apply 1 Application topically 2 (two) times daily. 01/26/24  Yes Pauline Garnette LABOR, MD    Family History Family History  Problem Relation Age of Onset   Heart attack Father    Heart disease Sister    Heart disease Sister    Rectal cancer Neg Hx    Stomach cancer Neg Hx     Social History Social History   Tobacco Use   Smoking status: Former    Current packs/day: 0.00    Types:  Cigarettes    Start date: 1958    Quit date: 1968    Years since quitting: 57.7   Smokeless tobacco: Never   Tobacco comments:    Former smoker 02/24/23  Vaping Use   Vaping status: Never Used  Substance Use Topics   Alcohol use: Never   Drug use: Never     Allergies   Codeine, Ace inhibitors, Cold relief plus  [chlorphen-phenyleph-asa], Epinephrine, Fexofenadine hcl, and Metoclopramide   Review of Systems Review of Systems See HPI  Physical Exam Triage Vital Signs ED Triage Vitals  Encounter Vitals Group     BP 03/09/24 0935 128/75     Girls Systolic BP Percentile --      Girls Diastolic BP Percentile --      Boys Systolic BP Percentile --      Boys Diastolic BP Percentile --      Pulse Rate 03/09/24 0935 (!) 57     Resp 03/09/24 0935 17     Temp 03/09/24 0935 97.6 F (36.4 C)     Temp Source 03/09/24 0935 Oral     SpO2 03/09/24 0935 95 %     Weight 03/09/24 0932 180 lb (81.6 kg)     Height 03/09/24 0932 5' 6 (1.676 m)     Head Circumference --      Peak Flow --      Pain Score 03/09/24 0932 0     Pain Loc --      Pain Education --      Exclude from Growth Chart --    No data found.  Updated Vital Signs BP 128/75 (BP Location: Right Arm)   Pulse (!) 57   Temp 97.6 F (36.4 C) (Oral)   Resp 17   Ht 5' 6 (1.676 m)   Wt 81.6 kg   SpO2 95%   BMI 29.05 kg/m       Physical Exam Constitutional:      General: She is not in acute distress.    Appearance: She is well-developed.  HENT:     Head: Normocephalic and atraumatic.     Right Ear: Tympanic membrane normal.     Left Ear: Tympanic membrane normal.     Nose: Nose normal. No congestion.     Mouth/Throat:     Mouth: Mucous membranes are moist.     Pharynx: No posterior oropharyngeal erythema.  Eyes:     Conjunctiva/sclera: Conjunctivae normal.     Pupils: Pupils are equal, round, and reactive to light.  Cardiovascular:     Rate and Rhythm: Normal rate.     Heart sounds: Murmur  heard.   Pulmonary:     Effort: Pulmonary effort is normal. No respiratory distress.     Breath sounds: Normal breath sounds. No wheezing.  Abdominal:     General: There is no distension.     Palpations: Abdomen is soft.  Musculoskeletal:        General: Normal range of motion.     Cervical back: Normal range of motion.  Skin:    General: Skin is warm and dry.     Findings: No rash.     Comments: Faint erythema, swelling across both cheeks with some edema below the left eye.  Neurological:     Mental Status: She is alert.      UC Treatments / Results  Labs (all labs ordered are listed, but only abnormal results are displayed) Labs Reviewed - No data to display  EKG   Radiology No results found.  Procedures Procedures (including critical care time)  Medications Ordered in UC Medications - No data to display  Initial Impression / Assessment and Plan / UC Course  I have reviewed the triage vital signs and the nursing notes.  Pertinent labs & imaging results that were available during my care of the patient were reviewed by me and considered in my medical decision making (see chart for details).    Discussed prednisone .  Patient declines.  Will treat with antihistamines and cool compresses. Final Clinical Impressions(s) / UC Diagnoses   Final diagnoses:  Allergic reaction, initial encounter  Facial swelling     Discharge Instructions      Take an antihistamine like Claritin (loratadine) or Zyrtec (cetirizine) This will reduce the itching and swelling Cool compresses will help See your doctor if not improving by Monday   ED Prescriptions   None    PDMP not reviewed this encounter.   Maranda Jamee Jacob, MD 03/09/24 [phone]

## 2024-03-09 NOTE — Discharge Instructions (Signed)
 Take an antihistamine like Claritin (loratadine) or Zyrtec (cetirizine) This will reduce the itching and swelling Cool compresses will help See your doctor if not improving by Monday

## 2024-03-09 NOTE — ED Triage Notes (Signed)
 Pt states that she has facial swelling, itching and redness. X1 day  Pt states that she wears oxygen and thinks the mask is causing the symptoms.

## 2024-03-10 ENCOUNTER — Telehealth: Payer: Self-pay

## 2024-03-10 NOTE — Telephone Encounter (Signed)
 Called to check on patient. Feeling same, will continue antihistamine and cold compresses until can speak to pcp tomorrow. Ne needs from ucc at this time.

## 2024-03-26 ENCOUNTER — Telehealth: Payer: Self-pay | Admitting: Cardiology

## 2024-03-26 NOTE — Telephone Encounter (Signed)
 Spoke with pt regarding her upcoming procedure. Pt stated she is having a cystoscopy on 9/17 and was wondering if she needs to hold her Eliquis . Pt stated her urologist said it would not be an issue but the pt is concerned about her thin tissues bleeding from the procedure. Pt was told her question would be forwarded to her provider for their advise. Pt verbalized understanding. All questions if any were answered.

## 2024-03-26 NOTE — Telephone Encounter (Signed)
 Pt is having an internal procedure done with urologist on 9/17 and wants to know if she thinks she should hold her Eliquis  for procedure. Pt spoke with Urology office and they said that it should not be an issue but she wanted to call Cardiologist office as well just to be sure. Please advise.

## 2024-03-27 NOTE — Progress Notes (Signed)
 HPI: FU atrial fibrillation.  Patient previously followed at [facility]  She had an episode of atrial fibrillation and underwent cardioversion October 01, 2020 and had been treated with amiodarone. Amiodarone was discontinued by pulmonary ultimately due to history of bronchiectasis. Monitor January 2023 showed sinus rhythm with brief runs of PAT, PACs and PVCs and 1 triplet.  Patient has contacted the office multiple times due to perceived side effects from apixaban  and Xarelto . Patient was seen by Dr. Cindie for consideration of ablation and felt not to be a good candidate.  He recommended dofetilide therapy if she has recurrences.  He also felt that given supplemental oxygen use she would be at prohibitive risk for watchman implant. Monitor April 2025 showed sinus rhythm with runs of atrial tachycardia.  Echocardiogram June 2025 showed normal LV function.  Underwent repeat cardioversion July 2025.  Has had multiple episodes of recurrent atrial fibrillation previously.  Since last seen, she denies dyspnea, chest pain, palpitations, syncope, bleeding or falls.  Current Outpatient Medications  Medication Sig Dispense Refill   acetaminophen (TYLENOL) 500 MG tablet Take 1,000-1,500 mg by mouth See admin instructions. Take 1500 mg in the morning, 1000 mg at bedtime     apixaban  (ELIQUIS ) 5 MG TABS tablet Take 1 tablet (5 mg total) by mouth 2 (two) times daily. 60 tablet 5   atorvastatin  (LIPITOR) 20 MG tablet Take 20 mg by mouth daily.     bisoprolol  (ZEBETA ) 5 MG tablet TAKE 1 TABLET BY MOUTH TWICE A DAY 180 tablet 3   MAGNESIUM GLYCINATE PO Take 200 mg by mouth daily.     olmesartan  (BENICAR ) 20 MG tablet Take 1 tablet (20 mg total) by mouth daily. 30 tablet 3   omeprazole (PRILOSEC) 20 MG capsule Take 20 mg by mouth daily.     OXYGEN Place 2 L into the nose at bedtime.     triamcinolone  cream (KENALOG ) 0.1 % Apply 1 Application topically 2 (two) times daily. 45 g 1   No current  facility-administered medications for this visit.     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   Hyperlipidemia    Hypertension    Iron deficiency anemia 02/05/2018   Wears dentures    full upper dentures and partial lower     Past Surgical History:  Procedure Laterality Date   CARDIOVERSION N/A 05/10/2022   Procedure: CARDIOVERSION;  Surgeon: Shlomo Wilbert SAUNDERS, MD;  Location: [facility] ENDOSCOPY;  Service: Cardiovascular;  Laterality: N/A;   CARDIOVERSION N/A 01/19/2024   Procedure: CARDIOVERSION;  Surgeon: Okey Vina GAILS, MD;  Location: [facility] INVASIVE CV LAB;  Service: Cardiovascular;  Laterality: N/A;   COLONOSCOPY  05/14/2009   Gessner/ adenoma 2007   COLONOSCOPY  05/2014   Dr Charmaine -[facility] - polyps   TOTAL HIP ARTHROPLASTY Right 2017   UPPER GASTROINTESTINAL ENDOSCOPY     vagiinal  hysterectomy  1977   WISDOM TOOTH EXTRACTION      Social History   Socioeconomic History   Marital status: Widowed    Spouse name: Not on file   Number of children: 1   Years of education: Not on file   Highest education level: Not on file  Occupational History   Not on file  Tobacco Use   Smoking status: Former    Current packs/day: 0.00    Types: Cigarettes    Start date: 36    Quit date: 1968    Years since quitting: 57.7   Smokeless tobacco: Never   Tobacco comments:    Former smoker 02/24/23  Vaping Use   Vaping status: Never Used  Substance and Sexual Activity   Alcohol use: Never    Drug use: Never   Sexual activity: Not Currently    Birth control/protection: Post-menopausal  Other Topics Concern   Not on file  Social History Narrative   Married   Former smoker, no alcohol no drug use   Social Drivers of Corporate investment banker Strain: Low Risk  (03/01/2022)   Received from [facility] visits prior to 09/10/2022., [facility]   Overall Financial Resource Strain (CARDIA)    Difficulty of Paying Living Expenses: Not very hard  Food Insecurity: Low Risk  (03/13/2024)   Received from [facility]   Hunger Vital Sign    Within the past 12 months, you worried that your food would run out before you got money to buy more: Never true    Within the past 12 months, the food you bought just didn't last and you didn't have money to get more. : Never true  Transportation Needs: No Transportation Needs (03/13/2024)   Received from [facility]    In the past 12 months, has lack of reliable transportation kept you from medical appointments, meetings, work or from getting things needed for daily living? : No  Physical Activity: Insufficiently Active (03/01/2022)   Received from [facility] visits prior to 09/10/2022., [facility]   Exercise Vital Sign    On average, how many days per week do you engage in moderate to strenuous exercise (like a brisk walk)?: 1 day    On average, how many minutes do you engage in exercise at this level?: 30 min  Stress: No Stress Concern Present (03/01/2022)   Received from [facility] visits prior to 09/10/2022., [facility]   [employer] of Occupational Health - Occupational Stress Questionnaire    Feeling of Stress : Only a little  Social Connections: Unknown (03/11/2023)   Received from [facility]   Social Network    Social Network: Not on file  Intimate Partner Violence: Unknown (03/11/2023)   Received from [facility]   HITS    Physically Hurt:  Not on file    Insult or Talk Down To: Not on file    Threaten Physical Harm: Not on file    Scream or Curse: Not on file    Family History  Problem Relation Age of Onset   Heart attack Father    Heart disease Sister    Heart disease Sister    Rectal cancer Neg Hx    Stomach cancer Neg Hx     ROS: no fevers or chills, productive cough, hemoptysis, dysphasia, odynophagia, melena, hematochezia, dysuria, hematuria, rash, seizure activity, orthopnea, PND, pedal edema, claudication. Remaining systems are negative.  Physical Exam: Well-developed well-nourished in no acute distress.  Skin is warm and dry.  HEENT is normal.  Neck is supple.  Chest is clear to auscultation with normal expansion.  Cardiovascular exam is regular rate and rhythm.  Abdominal exam nontender or distended. No masses palpated. Extremities show no edema. neuro grossly intact  A/P  1 paroxysmal atrial fibrillation-patient remains in sinus rhythm on exam.  She was previously evaluated by EP and felt not to be a candidate for either watchman or ablation.  Not a good candidate for amiodarone given history of underlying lung disease.  We have previously discussed Tikosyn at length and she would prefer to avoid but will consider if she has recurrences in the future.  Will continue Cardizem , bisoprolol  and apixaban .  2 hypertension-patient's blood pressure is controlled.  Continue present medications.  3 hyperlipidemia-continue statin.  4 palpitations-continue beta-blocker.  Redell Shallow, MD

## 2024-04-01 ENCOUNTER — Ambulatory Visit (INDEPENDENT_AMBULATORY_CARE_PROVIDER_SITE_OTHER): Admitting: Cardiology

## 2024-04-01 ENCOUNTER — Encounter: Payer: Self-pay | Admitting: Cardiology

## 2024-04-01 VITALS — BP 137/61 | HR 64 | Ht 66.0 in | Wt 179.0 lb

## 2024-04-01 DIAGNOSIS — I480 Paroxysmal atrial fibrillation: Secondary | ICD-10-CM

## 2024-04-01 DIAGNOSIS — I10 Essential (primary) hypertension: Secondary | ICD-10-CM | POA: Diagnosis not present

## 2024-04-01 DIAGNOSIS — E7800 Pure hypercholesterolemia, unspecified: Secondary | ICD-10-CM | POA: Diagnosis not present

## 2024-04-01 MED ORDER — DILTIAZEM HCL ER COATED BEADS 120 MG PO CP24: 120.0000 mg | ORAL_CAPSULE | Freq: Every day | ORAL | 3 refills | Status: AC

## 2024-04-01 MED ORDER — BISOPROLOL FUMARATE 5 MG PO TABS: 5.0000 mg | ORAL_TABLET | Freq: Two times a day (BID) | ORAL | 3 refills | Status: AC

## 2024-04-01 MED ORDER — OLMESARTAN MEDOXOMIL 20 MG PO TABS: 20.0000 mg | ORAL_TABLET | Freq: Every day | ORAL | 3 refills | Status: AC

## 2024-04-01 MED ORDER — APIXABAN 5 MG PO TABS: 5.0000 mg | ORAL_TABLET | Freq: Two times a day (BID) | ORAL | 3 refills | Status: AC

## 2024-04-01 MED ORDER — ATORVASTATIN CALCIUM 20 MG PO TABS: 20.0000 mg | ORAL_TABLET | Freq: Every day | ORAL | 3 refills | Status: AC

## 2024-04-01 NOTE — Telephone Encounter (Signed)
 Discussed at appointment 04/01/24

## 2024-04-01 NOTE — Patient Instructions (Signed)

## 2024-07-05 ENCOUNTER — Telehealth: Payer: Self-pay | Admitting: Cardiology

## 2024-07-05 NOTE — Telephone Encounter (Signed)
 She should be fine to take some loperamide.  It usually takes higher than recommended doses to cause heart rhythm issues, so if she just follows the directions on the box, she's good.

## 2024-07-05 NOTE — Telephone Encounter (Signed)
Spoke with the patient and advised on recommendations from PharmD. Patient verbalized understanding.  °

## 2024-07-05 NOTE — Telephone Encounter (Signed)
 Pt has diarrhea and would like advisory on whether the medication she has for it is safe to take with her afib. Please advise.

## 2024-07-05 NOTE — Telephone Encounter (Signed)
 Spoke with the patient who states that she was up all night with diarrhea. She is feeling better this morning and is hoping it has resolved. She does have some loperamide 2 mg to take if needed. She states that she read the bottle and it stated to check with your health care provider before taking if you have heart rhythm issues. She states that she has a history of afib so wanted to make sure that she was okay to take this if she needed it. Will send to PharmD to confirm this would be okay.

## 2024-09-25 ENCOUNTER — Ambulatory Visit: Admitting: Cardiology
